# Patient Record
Sex: Female | Born: 1985 | Race: White | Hispanic: No | Marital: Married | Smoking: Never smoker
Health system: Southern US, Community
[De-identification: ages and names within clinical notes are randomized; demographics above are authoritative.]

## PROBLEM LIST (undated history)

## (undated) DIAGNOSIS — Z8489 Family history of other specified conditions: Secondary | ICD-10-CM

## (undated) DIAGNOSIS — N83209 Unspecified ovarian cyst, unspecified side: Secondary | ICD-10-CM

## (undated) DIAGNOSIS — T883XXA Malignant hyperthermia due to anesthesia, initial encounter: Secondary | ICD-10-CM

## (undated) DIAGNOSIS — K219 Gastro-esophageal reflux disease without esophagitis: Secondary | ICD-10-CM

## (undated) HISTORY — DX: Unspecified ovarian cyst, unspecified side: N83.209

## (undated) HISTORY — PX: WISDOM TOOTH EXTRACTION: SHX21

---
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
## 2005-01-07 ENCOUNTER — Emergency Department (HOSPITAL_COMMUNITY): Admission: EM | Admit: 2005-01-07 | Discharge: 2005-01-07 | Payer: Self-pay | Admitting: Emergency Medicine

## 2005-07-12 ENCOUNTER — Observation Stay: Payer: Self-pay | Admitting: Obstetrics and Gynecology

## 2005-09-17 ENCOUNTER — Emergency Department (HOSPITAL_COMMUNITY): Admission: EM | Admit: 2005-09-17 | Discharge: 2005-09-17 | Payer: Self-pay | Admitting: Emergency Medicine

## 2005-10-19 ENCOUNTER — Inpatient Hospital Stay: Payer: Self-pay | Admitting: Unknown Physician Specialty

## 2008-06-12 ENCOUNTER — Observation Stay: Payer: Self-pay | Admitting: Obstetrics & Gynecology

## 2012-12-26 ENCOUNTER — Emergency Department: Payer: Self-pay | Admitting: Emergency Medicine

## 2013-08-10 ENCOUNTER — Encounter: Payer: Self-pay | Admitting: Physician Assistant

## 2013-08-10 ENCOUNTER — Ambulatory Visit (INDEPENDENT_AMBULATORY_CARE_PROVIDER_SITE_OTHER): Payer: BC Managed Care – PPO | Admitting: Physician Assistant

## 2013-08-10 VITALS — BP 116/72 | HR 76 | Temp 98.4°F | Resp 18 | Ht 61.5 in | Wt 130.0 lb

## 2013-08-10 DIAGNOSIS — R197 Diarrhea, unspecified: Secondary | ICD-10-CM

## 2013-08-10 DIAGNOSIS — R110 Nausea: Secondary | ICD-10-CM

## 2013-08-10 DIAGNOSIS — A084 Viral intestinal infection, unspecified: Secondary | ICD-10-CM

## 2013-08-10 DIAGNOSIS — A088 Other specified intestinal infections: Secondary | ICD-10-CM

## 2013-08-10 LAB — CBC W/MCH & 3 PART DIFF
HCT: 46 % (ref 36.0–46.0)
Hemoglobin: 14.4 g/dL (ref 12.0–15.0)
Lymphocytes Relative: 16 % (ref 12–46)
Lymphs Abs: 0.9 10*3/uL (ref 0.7–4.0)
MCH: 29.1 pg (ref 26.0–34.0)
MCHC: 31.3 g/dL (ref 30.0–36.0)
MCV: 93.1 fL (ref 78.0–100.0)
Neutro Abs: 4 10*3/uL (ref 1.7–7.7)
Neutrophils Relative %: 74 % (ref 43–77)
Platelets: 199 10*3/uL (ref 150–400)
RBC: 4.94 MIL/uL (ref 3.87–5.11)
RDW: 17.1 % — ABNORMAL HIGH (ref 11.5–15.5)
WBC mixed population %: 10 % (ref 3–18)
WBC mixed population: 0.6 10*3/uL (ref 0.1–1.8)
WBC: 5.4 10*3/uL (ref 4.0–10.5)

## 2013-08-10 NOTE — Progress Notes (Signed)
Patient ID: Melissa David MRN: [id number], DOB: [birth year], 28 y.o. Date of Encounter: 08/10/2013, 2:47 PM    Chief Complaint:  Chief Complaint  Patient presents with  . sick x 4-5 days    started with diarrhea now with body aches and fatique     HPI: 28 y.o. year old white female reports that she started feeling sick around Wednesday 08/05/13 - Thursday 08/06/13. At that time  just had some mild diarrhea. However since then the diarrhea has worsened. Now the" diarrhea is just running out like water" and it has increased in frequency.  is having multiple episodes per day now. Also has had some headache. Yesterday she laid down to take a nap and felt achy all over from head to toel. Has had no localized/ focal abdominal pain. Has had some nausea but no vomiting. Has had no fevers or chills.     Home Meds: See attached medication section for any medications that were entered at today's visit. The computer does not put those onto this list.The following list is a list of meds entered prior to today's visit.   No current outpatient prescriptions on file prior to visit.   No current facility-administered medications on file prior to visit.    Allergies: No Known Allergies    Review of Systems: See HPI for pertinent ROS. All other ROS negative.    Physical Exam: Blood pressure 116/72, pulse 76, temperature 98.4 F (36.9 C), temperature source Oral, resp. rate 18, height 5' 1.5" (1.562 m), weight 130 lb (58.968 kg)., Body mass index is 24.17 kg/(m^2). General:  WNWD WF. Appears in no acute distress. Neck: Supple. No thyromegaly. No lymphadenopathy. Lungs: Clear bilaterally to auscultation without wheezes, rales, or rhonchi. Breathing is unlabored. Heart: Regular rhythm. No murmurs, rubs, or gallops. Abdomen: Soft, non-tender, non-distended with normoactive bowel sounds. No hepatomegaly. No rebound/guarding. No obvious abdominal masses. Firmly palpated abdomen and she reports no areas  of tenderness. Msk:  Strength and tone normal for age. Extremities/Skin: Warm and dry. Neuro: Alert and oriented X 3. Moves all extremities spontaneously. Gait is normal. CNII-XII grossly in tact. Psych:  Responds to questions appropriately with a normal affect.   Results for orders placed in visit on 08/10/13  CBC [facility] & 3 PART DIFF      Result Value Ref Range   WBC 5.4  4.0 - 10.5 K/uL   RBC 4.94  3.87 - 5.11 MIL/uL   Hemoglobin 14.4  12.0 - 15.0 g/dL   HCT 40.9  81.1 - 91.4 %   MCV 93.1  78.0 - 100.0 fL   MCH 29.1  26.0 - 34.0 pg   MCHC 31.3  30.0 - 36.0 g/dL   RDW 78.2 (*) 95.6 - 21.3 %   Platelets 199  150 - 400 K/uL   Neutrophils Relative % 74  43 - 77 %   Neutro Abs 4.0  1.7 - 7.7 K/uL   Lymphocytes Relative 16  12 - 46 %   Lymphs Abs 0.9  0.7 - 4.0 K/uL   WBC mixed population % 10  3 - 18 %   WBC mixed population 0.6  0.1 - 1.8 K/uL      ASSESSMENT AND PLAN:  28 y.o. year old female with  1. Diarrhea - CBC w/[facility] & 3 Part Diff  2. Nausea - CBC w/[facility] & 3 Part Diff  Viral Gastroenteritis: Discussed with her that this is most consistent with viral gastroenteritis. If this is the  case then this should run its course and resolve on its own over the next few days. In the meantime drink small sips of fluid frequently to prevent dehydration. Use clear liquid diet including ginger ale and chicken noodle soup. Once diarrhea improves can gradually go to a bland diet as tolerated. If diarrhea not improved in 3 days then followup. If develops fever or local/localized abdominal pain then follow up immediately.  Murray HodgkinsSigned, Adonus Uselman Beth AllgoodDixon, [place], [facility] 08/10/2013 2:47 PM

## 2013-08-24 ENCOUNTER — Ambulatory Visit: Payer: BC Managed Care – PPO | Admitting: Family Medicine

## 2014-02-08 ENCOUNTER — Ambulatory Visit: Payer: BC Managed Care – PPO | Admitting: Family Medicine

## 2014-08-11 ENCOUNTER — Encounter: Payer: Self-pay | Admitting: Physician Assistant

## 2014-08-11 ENCOUNTER — Ambulatory Visit (INDEPENDENT_AMBULATORY_CARE_PROVIDER_SITE_OTHER): Payer: BLUE CROSS/BLUE SHIELD | Admitting: Physician Assistant

## 2014-08-11 VITALS — BP 116/78 | HR 68 | Temp 97.9°F | Resp 18 | Wt 137.0 lb

## 2014-08-11 DIAGNOSIS — R509 Fever, unspecified: Secondary | ICD-10-CM

## 2014-08-11 DIAGNOSIS — L03811 Cellulitis of head [any part, except face]: Secondary | ICD-10-CM

## 2014-08-11 DIAGNOSIS — A084 Viral intestinal infection, unspecified: Secondary | ICD-10-CM

## 2014-08-11 LAB — INFLUENZA A AND B
INFLUENZA A AG: NEGATIVE
Influenza B Ag: NEGATIVE

## 2014-08-11 MED ORDER — SULFAMETHOXAZOLE-TRIMETHOPRIM 800-160 MG PO TABS: 1.0000 | ORAL_TABLET | Freq: Two times a day (BID) | ORAL | Status: DC

## 2014-08-11 NOTE — Progress Notes (Signed)
    Patient ID: Melissa David MRN: [id number], DOB: July 24, 1985, 29 y.o. Date of Encounter: 08/11/2014, 2:28 PM    Chief Complaint:  Chief Complaint  Patient presents with  . bump on back of head,painful  . sick    fever, vomiting, chills     HPI: 29 y.o. year old white female presents with above symptoms.  Says that she vomited one time last night. This morning had some nausea but no further vomiting. Had some diarrhea this morning. Last night woke up sweating so assumes that she was having fever but has not actually checked it with a thermometer. Has not eaten today to know whether she may have any further vomiting or diarrhea if she does have anything to eat.  Says that she noticed this bump on the lower aspect of posterior of her scalp Friday 08/06/14. Says that by that Sunday, February 21 it seemed to be more sore. Says that since then it ahas seemed to stay about the same--no worse but really no better either. Has had no drainage from site.      Home Meds:   No outpatient prescriptions prior to visit.   No facility-administered medications prior to visit.    Allergies: No Known Allergies    Review of Systems: See HPI for pertinent ROS. All other ROS negative.    Physical Exam: Blood pressure 116/78, pulse 68, temperature 97.9 F (36.6 C), temperature source Oral, resp. rate 18, weight 137 lb (62.143 kg)., Body mass index is 25.47 kg/(m^2). General: WNWD WF.  Appears in no acute distress. Neck: Supple. No thyromegaly. No lymphadenopathy. Lungs: Clear bilaterally to auscultation without wheezes, rales, or rhonchi. Breathing is unlabored. Heart: Regular rhythm. No murmurs, rubs, or gallops. Abdomen: Soft, non-tender, non-distended with normoactive bowel sounds. No hepatomegaly. No rebound/guarding. No obvious abdominal masses. Abdomen is soft. There is no area of tenderness with palpation. Msk:  Strength and tone normal for age. Extremities/Skin: Warm and  dry. Posterior Head---Approx 1 inch above hairline at posterior neck-- there is approx 1/4 cm diameter area of golden scab. Not raised. No underlying abscess. No erythema.  Neuro: Alert and oriented X 3. Moves all extremities spontaneously. Gait is normal. CNII-XII grossly in tact. Psych:  Responds to questions appropriately with a normal affect.     ASSESSMENT AND PLAN:  29 y.o. year old female with  1. Viral gastroenteritis Clear liquid diet and then gradually slowly convert to bland diet.  2. Cellulitis of scalp Reassured patient of findings of the scalp.  she is concerned--says it has been throbbing pain. There is nothing present for me to incise and drain. Will go ahead and treat with antibiotic. Discussed with her that her nausea vomiting diarrhea must resolve and she must be keeping down food before starting the antibiotics so they do not cause further GI upset. - sulfamethoxazole-trimethoprim (BACTRIM DS,SEPTRA DS) 800-160 MG per tablet; Take 1 tablet by mouth 2 (two) times daily.  Dispense: 14 tablet; Refill: 0  3. Fever and chills - Influenza a and b Results for orders placed or performed in visit on 08/11/14  Influenza a and b  Result Value Ref Range   Source-INFBD NASAL    Inflenza A Ag NEG Negative   Influenza B Ag NEG Negative     Signed, [address]Mary Beth DresdenDixon, [place], [facility] 08/11/2014 2:28 PM

## 2015-03-02 ENCOUNTER — Encounter: Payer: Self-pay | Admitting: Family Medicine

## 2015-03-02 ENCOUNTER — Ambulatory Visit (INDEPENDENT_AMBULATORY_CARE_PROVIDER_SITE_OTHER): Payer: BLUE CROSS/BLUE SHIELD | Admitting: Family Medicine

## 2015-03-02 VITALS — BP 116/70 | HR 82 | Temp 97.9°F | Resp 14 | Ht 61.5 in | Wt 138.0 lb

## 2015-03-02 DIAGNOSIS — L0201 Cutaneous abscess of face: Secondary | ICD-10-CM

## 2015-03-02 MED ORDER — CEPHALEXIN 500 MG PO CAPS: 500.0000 mg | ORAL_CAPSULE | Freq: Two times a day (BID) | ORAL | Status: DC

## 2015-03-02 NOTE — Progress Notes (Signed)
Patient ID: Melissa David, female   DOB: 1985/10/28, 29 y.o.   MRN: [id number]   Subjective:    Patient ID: Melissa David, female    DOB: 13-Dec-1985, 29 y.o.   MRN: [id number]  Patient presents for Hard Knot to L Cheek  Pt here with swelling to her left cheek, 4 days ago a red bump came up, she thought it was a pimple and tried to pop it but no fluid came out, since then she has a hard knot and has had swelling of her cheek, worse in the morning the past 2 days, the area is also a little tender. Denies any fever, N/V, no sore throat or recent illness.   History of severe strep/staph infection on face as a child     Review Of Systems:  GEN- denies fatigue, fever, weight loss,weakness, recent illness HEENT- denies eye drainage, change in vision, nasal discharge, CVS- denies chest pain, palpitations RESP- denies SOB, cough, wheeze ABD- denies N/V, change in stools, abd pain GU- denies dysuria, hematuria, dribbling, incontinence MSK- denies joint pain, muscle aches, injury Neuro- denies headache, dizziness, syncope, seizure activity       Objective:    BP 116/70 mmHg  Pulse 82  Temp(Src) 97.9 F (36.6 C) (Oral)  Resp 14  Ht 5' 1.5" (1.562 m)  Wt 138 lb (62.596 kg)  BMI 25.66 kg/m2  LMP 02/19/2015 GEN- NAD, alert and oriented x3 HEENT- PERRL, EOMI, non injected sclera, pink conjunctiva, MMM, oropharynx clear Neck- Supple, no LAD Skin -- Left cheek- dime size indurated area , no fluctance, mild swelling, no erythema,mild TTP , no other skin lesions        Assessment & Plan:      Problem List Items Addressed This Visit    None    Visit Diagnoses    Facial abscess    -  Primary    I think she is developing a small abscess over her cheek, this is early on and with her history of infection will start Keflex antibotics, warm compresses, discussed red flags to return if swelling worsend. No area to open today        Note: This dictation was prepared with Dragon dictation  along with smaller phrase technology. Any transcriptional errors that result from this process are unintentional.

## 2015-03-02 NOTE — Patient Instructions (Signed)
Antibiotics as prescribed Use compresses 3-4 times a day  F/U as previous

## 2015-05-16 ENCOUNTER — Encounter: Payer: Self-pay | Admitting: Family Medicine

## 2015-05-16 ENCOUNTER — Ambulatory Visit (INDEPENDENT_AMBULATORY_CARE_PROVIDER_SITE_OTHER): Payer: BLUE CROSS/BLUE SHIELD | Admitting: Family Medicine

## 2015-05-16 VITALS — BP 128/78 | HR 90 | Temp 98.1°F | Resp 18 | Ht 61.5 in | Wt 142.0 lb

## 2015-05-16 DIAGNOSIS — J040 Acute laryngitis: Secondary | ICD-10-CM

## 2015-05-16 DIAGNOSIS — J0100 Acute maxillary sinusitis, unspecified: Secondary | ICD-10-CM

## 2015-05-16 MED ORDER — FLUTICASONE PROPIONATE 50 MCG/ACT NA SUSP: 2.0000 | Freq: Every day | NASAL | Status: DC

## 2015-05-16 MED ORDER — AMOXICILLIN 500 MG PO CAPS: 500.0000 mg | ORAL_CAPSULE | Freq: Three times a day (TID) | ORAL | Status: DC

## 2015-05-16 MED ORDER — GUAIFENESIN-CODEINE 100-10 MG/5ML PO SOLN: 5.0000 mL | Freq: Four times a day (QID) | ORAL | Status: DC | PRN

## 2015-05-16 NOTE — Progress Notes (Signed)
Patient ID: Melissa David, female   DOB: 08/24/1985, 29 y.o.   MRN: [id number]   Subjective:    Patient ID: Melissa David, female    DOB: 08/24/1985, 29 y.o.   MRN: [id number]  Patient presents for Illness   Pt here with sinus pressure, drainage, cough with productive, mostly from drainage- has to clear throat all day. Symptoms > 1 week, +sick contact with daughter, no fever  Hoarse for the past 4 days. Using mucinex with minimal improvement    Review Of Systems:  GEN- denies fatigue, fever, weight loss,weakness, recent illness HEENT- denies eye drainage, change in vision, +nasal discharge, CVS- denies chest pain, palpitations RESP- denies SOB, cough, wheeze ABD- denies N/V, change in stools, abd pain Neuro- + headache, dizziness, syncope, seizure activity       Objective:    BP 128/78 mmHg  Pulse 90  Temp(Src) 98.1 F (36.7 C) (Oral)  Resp 18  Ht 5' 1.5" (1.562 m)  Wt 142 lb (64.411 kg)  BMI 26.40 kg/m2  SpO2 99% GEN- NAD, alert and oriented x3, hoarse voice  HEENT- PERRL, EOMI, non injected sclera, pink conjunctiva, MMM, oropharynx mild injection, TM clear bilat no effusion,  + maxillary sinus tenderness, inflammed turbinates,  Nasal drainage  Neck- Supple, no LAD CVS- RRR, no murmur RESP-CTAB EXT- No edema Pulses- Radial 2+          Assessment & Plan:      Problem List Items Addressed This Visit    None    Visit Diagnoses    Acute maxillary sinusitis, recurrence not specified    -  Primary    Relevant Medications    amoxicillin (AMOXIL) 500 MG capsule    guaiFENesin-codeine 100-10 MG/5ML syrup    Acute laryngitis           Note: This dictation was prepared with Dragon dictation along with smaller phrase technology. Any transcriptional errors that result from this process are unintentional.

## 2015-05-16 NOTE — Patient Instructions (Signed)
Take antibiotics as prescribed Use flonase Cough syrup F/U as needed

## 2015-11-07 ENCOUNTER — Ambulatory Visit (INDEPENDENT_AMBULATORY_CARE_PROVIDER_SITE_OTHER): Payer: BLUE CROSS/BLUE SHIELD | Admitting: Family Medicine

## 2015-11-07 ENCOUNTER — Encounter: Payer: Self-pay | Admitting: Family Medicine

## 2015-11-07 VITALS — BP 110/74 | HR 80 | Temp 98.1°F | Resp 16 | Wt 140.0 lb

## 2015-11-07 DIAGNOSIS — J069 Acute upper respiratory infection, unspecified: Secondary | ICD-10-CM

## 2015-11-07 MED ORDER — AZITHROMYCIN 250 MG PO TABS: ORAL_TABLET | ORAL | Status: DC

## 2015-11-07 NOTE — Progress Notes (Signed)
   Subjective:    Patient ID: Melissa David, female    DOB: 02/10/1986, 30 y.o.   MRN: [id number]  HPI Son has strep throat. Daughter has left otitis media. At approximately the same time, the patient developed a severe cough that will not stop. Cough is productive of yellow sputum and she reports chest congestion subjective fevers and a sore throat. She denies any rhinorrhea or head congestion or sinus pain No past medical history on file. No past surgical history on file. Current Outpatient Prescriptions on File Prior to Visit  Medication Sig Dispense Refill  . fluticasone (FLONASE) 50 MCG/ACT nasal spray Place 2 sprays into both nostrils daily. 16 g 6   No current facility-administered medications on file prior to visit.   No Known Allergies Social History   Social History  . Marital Status: Single    Spouse Name: N/A  . Number of Children: N/A  . Years of Education: N/A   Occupational History  . Not on file.   Social History Main Topics  . Smoking status: Never Smoker   . Smokeless tobacco: Never Used  . Alcohol Use: No  . Drug Use: No  . Sexual Activity: Not on file   Other Topics Concern  . Not on file   Social History Narrative      Review of Systems  All other systems reviewed and are negative.      Objective:   Physical Exam  Constitutional: She appears well-developed and well-nourished.  HENT:  Nose: Nose normal.  Mouth/Throat: Oropharynx is clear and moist. No oropharyngeal exudate.  Eyes: Conjunctivae are normal.  Neck: Neck supple.  Cardiovascular: Normal rate, regular rhythm and normal heart sounds.   No murmur heard. Pulmonary/Chest: Effort normal and breath sounds normal. No respiratory distress. She has no wheezes. She has no rales.  Abdominal: Soft. Bowel sounds are normal.  Lymphadenopathy:    She has no cervical adenopathy.  Vitals reviewed.         Assessment & Plan:  Acute URI - Plan: azithromycin (ZITHROMAX) 250 MG  tablet  Usually I would say this is a virus but given her recent sick contacts I'm not so sure. I will treat the patient with a Z-Pak for bacterial upper respiratory infection. She can use Mucinex for cough or Delsym for cough

## 2016-10-11 ENCOUNTER — Encounter: Payer: Self-pay | Admitting: Family Medicine

## 2016-12-24 ENCOUNTER — Encounter: Payer: Self-pay | Admitting: Family Medicine

## 2017-03-28 ENCOUNTER — Ambulatory Visit: Payer: BLUE CROSS/BLUE SHIELD | Admitting: Physician Assistant

## 2017-05-23 ENCOUNTER — Ambulatory Visit: Payer: BLUE CROSS/BLUE SHIELD | Admitting: Maternal Newborn

## 2018-04-08 ENCOUNTER — Ambulatory Visit (INDEPENDENT_AMBULATORY_CARE_PROVIDER_SITE_OTHER): Payer: BLUE CROSS/BLUE SHIELD | Admitting: Family Medicine

## 2018-04-08 ENCOUNTER — Encounter: Payer: Self-pay | Admitting: Family Medicine

## 2018-04-08 VITALS — BP 110/70 | HR 72 | Temp 98.0°F | Resp 16 | Ht 63.0 in | Wt 143.0 lb

## 2018-04-08 DIAGNOSIS — M19079 Primary osteoarthritis, unspecified ankle and foot: Secondary | ICD-10-CM

## 2018-04-08 MED ORDER — PREDNISONE 20 MG PO TABS: ORAL_TABLET | ORAL | 0 refills | Status: DC

## 2018-04-08 NOTE — Progress Notes (Signed)
Subjective:    Patient ID: Melissa David, female    DOB: [birth year], 32 y.o.   MRN: [id number]  HPI  2 weeks ago, patient developed the gradual onset of pain and swelling in her left first MTP joint.  On visual inspection, the patient has hallux valgum.  The joint itself is slightly erythematous and warm to the touch.  This is asymmetric compared to the right side.  There is no significant effusion or swelling.  She has full dorsiflexion without pain.  Plantarflexion elicits pain along the superior aspect of the first MTP joint.  She denies any falls or injuries or trauma.  She has no history of gout.  She does not drink alcohol or eat excessive meat.  There is no family history of gout or autoimmune arthritides.  No past medical history on file. No past surgical history on file. No current outpatient medications on file prior to visit.   No current facility-administered medications on file prior to visit.    No Known Allergies Social History   Socioeconomic History  . Marital status: Single    Spouse name: Not on file  . Number of children: Not on file  . Years of education: Not on file  . Highest education level: Not on file  Occupational History  . Not on file  Social Needs  . Financial resource strain: Not on file  . Food insecurity:    Worry: Not on file    Inability: Not on file  . Transportation needs:    Medical: Not on file    Non-medical: Not on file  Tobacco Use  . Smoking status: Never Smoker  . Smokeless tobacco: Never Used  Substance and Sexual Activity  . Alcohol use: No  . Drug use: No  . Sexual activity: Not on file  Lifestyle  . Physical activity:    Days per week: Not on file    Minutes per session: Not on file  . Stress: Not on file  Relationships  . Social connections:    Talks on phone: Not on file    Gets together: Not on file    Attends religious service: Not on file    Active member of club or organization: Not on file    Attends meetings  of clubs or organizations: Not on file    Relationship status: Not on file  . Intimate partner violence:    Fear of current or ex partner: Not on file    Emotionally abused: Not on file    Physically abused: Not on file    Forced sexual activity: Not on file  Other Topics Concern  . Not on file  Social History Narrative  . Not on file      Review of Systems  All other systems reviewed and are negative.      Objective:   Physical Exam  Constitutional: She appears well-developed and well-nourished.  Eyes: Conjunctivae are normal.  Neck: Neck supple.  Cardiovascular: Normal rate, regular rhythm and normal heart sounds.  No murmur heard. Pulmonary/Chest: Effort normal and breath sounds normal. No respiratory distress. She has no wheezes. She has no rales.  Abdominal: Soft. Bowel sounds are normal.  Musculoskeletal:       Left ankle: She exhibits deformity. She exhibits no swelling. Tenderness. Achilles tendon normal.       Feet:  Vitals reviewed.         Assessment & Plan:  1st MTP arthritis - Plan: Uric acid, Sedimentation rate,  predniSONE (DELTASONE) 20 MG tablet  I believe the patient is experiencing inflammation around the first MTP joint.  Differential diagnosis includes traumatic arthritis secondary to recent exercise she has been performing, gout, autoimmune arthritides.  I believe septic arthritis is highly unlikely.  There is no penetrating trauma and the erythema is mild.  I recommended prednisone taper pack as she is tried and failed NSAIDs over-the-counter.  I will check a uric acid level and a sedimentation rate.  Recheck if no better after prednisone or immediately if worsening.  Obtain x-ray if no better liver I have a low clinical suspicion for fracture

## 2018-04-09 LAB — SEDIMENTATION RATE: Sed Rate: 6 mm/h (ref 0–20)

## 2018-04-09 LAB — URIC ACID: Uric Acid, Serum: 4.3 mg/dL (ref 2.5–7.0)

## 2018-04-24 ENCOUNTER — Ambulatory Visit (INDEPENDENT_AMBULATORY_CARE_PROVIDER_SITE_OTHER): Payer: BLUE CROSS/BLUE SHIELD

## 2018-04-24 ENCOUNTER — Encounter: Payer: Self-pay | Admitting: Podiatry

## 2018-04-24 ENCOUNTER — Ambulatory Visit: Payer: BLUE CROSS/BLUE SHIELD | Admitting: Podiatry

## 2018-04-24 DIAGNOSIS — M778 Other enthesopathies, not elsewhere classified: Secondary | ICD-10-CM

## 2018-04-24 DIAGNOSIS — M2012 Hallux valgus (acquired), left foot: Secondary | ICD-10-CM | POA: Diagnosis not present

## 2018-04-24 DIAGNOSIS — M779 Enthesopathy, unspecified: Secondary | ICD-10-CM

## 2018-04-24 MED ORDER — MELOXICAM 15 MG PO TABS: 15.0000 mg | ORAL_TABLET | Freq: Every day | ORAL | 3 refills | Status: DC

## 2018-04-24 NOTE — Progress Notes (Signed)
  Subjective:  Patient ID: Melissa David, female    DOB: 02-Mar-1986,  MRN: [id number] HPI Chief Complaint  Patient presents with  . Foot Pain    Patient presents today for left foot pain at 1st mpj joint x 1 month. She states "my toe feels stiff and hard to bend sometimes and burns"  She saw her PCP and was given prednisone x 7 days which didn't help much.  She has also been using ice and elevating    32 y.o. female presents with the above complaint.   ROS: Denies fever chills nausea vomiting muscle aches pains calf pain back pain chest pain shortness of breath.  No past medical history on file. No past surgical history on file.  Current Outpatient Medications:  .  meloxicam (MOBIC) 15 MG tablet, Take 1 tablet (15 mg total) by mouth daily., Disp: 30 tablet, Rfl: 3  No Known Allergies Review of Systems Objective:  There were no vitals filed for this visit.  General: Well developed, nourished, in no acute distress, alert and oriented x3   Dermatological: Skin is warm, dry and supple bilateral. Nails x 10 are well maintained; remaining integument appears unremarkable at this time. There are no open sores, no preulcerative lesions, no rash or signs of infection present.  Vascular: Dorsalis Pedis artery and Posterior Tibial artery pedal pulses are 2/4 bilateral with immedate capillary fill time. Pedal hair growth present. No varicosities and no lower extremity edema present bilateral.   Neruologic: Grossly intact via light touch bilateral. Vibratory intact via tuning fork bilateral. Protective threshold with Semmes Wienstein monofilament intact to all pedal sites bilateral. Patellar and Achilles deep tendon reflexes 2+ bilateral. No Babinski or clonus noted bilateral.   Musculoskeletal: No gross boney pedal deformities bilateral. No pain, crepitus, or limitation noted with foot and ankle range of motion bilateral. Muscular strength 5/5 in all groups tested bilateral.  Hallux abductovalgus  deformity appears to be track bound she has pain on range of motion of the joint there is no crepitation.  Mild edema no erythema cellulitis drainage or odor.  No open lesions or wounds.  Gait: Unassisted, Nonantalgic.    Radiographs:  Radiographs demonstrate an increase in the first intermetatarsal angle greater than normal value consistent with hallux abductovalgus deformity joint space narrowing no spurring.    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  Plan: Discussed etiology pathology conservative versus surgical therapies.  At this point after sterile Betadine skin prep I injected 10 mg of Kenalog and 5 mg of local anesthetic intra-articularly to the first metatarsal phalangeal joint.  She tolerated procedure well without complications.  Started her on meloxicam.  I will follow-up with her in 1 month to 6 weeks.  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  Remember to ask how her trip to [place] [employer] [place] went.     Brad Lieurance T. [place], [place]

## 2018-05-29 ENCOUNTER — Ambulatory Visit: Payer: BLUE CROSS/BLUE SHIELD | Admitting: Podiatry

## 2018-06-03 ENCOUNTER — Encounter: Payer: Self-pay | Admitting: Podiatry

## 2018-06-03 ENCOUNTER — Ambulatory Visit: Payer: BLUE CROSS/BLUE SHIELD | Admitting: Podiatry

## 2018-06-03 DIAGNOSIS — M2012 Hallux valgus (acquired), left foot: Secondary | ICD-10-CM

## 2018-06-03 NOTE — Progress Notes (Signed)
She presents today states that she would like to consider surgical intervention to the first metatarsophalangeal joint of the left foot.  She states is been bothering her for quite some time she is ready to get this corrected.  She states that is starting to affect her ability to perform her day every day activities.  Objective: I have reviewed her past medical history medications allergies surgery social history review of systems.  Pulses are strongly palpable.  She has pain on palpation of the second metatarsal phalangeal joint and pain on palpation of the hypertrophic medial condyle and range of motion of the first metatarsophalangeal joint.  No open lesions or wounds are noted.  Assessment: Capsulitis second metatarsal phalangeal joint elongated plantarflexed second met.  Hallux valgus deformity with capsulitis.  Plan: We discussed etiology pathology conservative versus surgical therapies.  At this point we consented her for an [facility] bunion repair left foot a second metatarsal osteotomy with double screw fixation left foot.  She understands this and signed all 3 pages a consent form.  We discussed the possible postop complications which may include but not limited to postop pain bleeding swelling infection recurrence need further surgery overcorrection under correction loss of digit loss of limb loss of life.  Dispensed a Cam walker today provided her with both oral and written home-going instructions for the care of her foot as well as her in the morning of preop as well as information regarding the surgery center and the anesthesia group.

## 2018-06-03 NOTE — Patient Instructions (Signed)
Pre-Operative Instructions  Congratulations, you have decided to take an important step towards improving your quality of life.  You can be assured that the doctors and staff at [facility] will be with you every step of the way.  Here are some important things you should know:  1. Plan to be at the surgery center/hospital at least 1 (one) hour prior to your scheduled time, unless otherwise directed by the surgical center/hospital staff.  You must have a responsible adult accompany you, remain during the surgery and drive you home.  Make sure you have directions to the surgical center/hospital to ensure you arrive on time. 2. If you are having surgery at [facility] or [facility], you will need a copy of your medical history and physical form from your family physician within one month prior to the date of surgery. We will give you a form for your primary physician to complete.  3. We make every effort to accommodate the date you request for surgery.  However, there are times where surgery dates or times have to be moved.  We will contact you as soon as possible if a change in schedule is required.   4. No aspirin/ibuprofen for one week before surgery.  If you are on aspirin, any non-steroidal anti-inflammatory medications (Mobic, Aleve, Ibuprofen) should not be taken seven (7) days prior to your surgery.  You make take Tylenol for pain prior to surgery.  5. Medications - If you are taking daily heart and blood pressure medications, seizure, reflux, allergy, asthma, anxiety, pain or diabetes medications, make sure you notify the surgery center/hospital before the day of surgery so they can tell you which medications you should take or avoid the day of surgery. 6. No food or drink after midnight the night before surgery unless directed otherwise by surgical center/hospital staff. 7. No alcoholic beverages 24-hours prior to surgery.  No smoking 24-hours prior or 24-hours after  surgery. 8. Wear loose pants or shorts. They should be loose enough to fit over bandages, boots, and casts. 9. Don't wear slip-on shoes. Sneakers are preferred. 10. Bring your boot with you to the surgery center/hospital.  Also bring crutches or a walker if your physician has prescribed it for you.  If you do not have this equipment, it will be provided for you after surgery. 11. If you have not been contacted by the surgery center/hospital by the day before your surgery, call to confirm the date and time of your surgery. 12. Leave-time from work may vary depending on the type of surgery you have.  Appropriate arrangements should be made prior to surgery with your employer. 13. Prescriptions will be provided immediately following surgery by your doctor.  Fill these as soon as possible after surgery and take the medication as directed. Pain medications will not be refilled on weekends and must be approved by the doctor. 14. Remove nail polish on the operative foot and avoid getting pedicures prior to surgery. 15. Wash the night before surgery.  The night before surgery wash the foot and leg well with water and the antibacterial soap provided. Be sure to pay special attention to beneath the toenails and in between the toes.  Wash for at least three (3) minutes. Rinse thoroughly with water and dry well with a towel.  Perform this wash unless told not to do so by your physician.  Enclosed: 1 Ice pack (please put in freezer the night before surgery)   1 Hibiclens skin cleaner     Pre-op instructions  If you have any questions regarding the instructions, please do not hesitate to call our office.  [place]: [address], [place], [place] [address] -- [phone]  [place]: [address] [place], [place] [address] -- [phone]  [place]: [address]  [place], [place] [address] -- [phone]  [place]: [address], [place], [place] [address] -- [phone]  Website:  [email or web address] 

## 2018-06-05 ENCOUNTER — Ambulatory Visit: Payer: BLUE CROSS/BLUE SHIELD | Admitting: Podiatry

## 2018-06-09 ENCOUNTER — Other Ambulatory Visit: Payer: Self-pay | Admitting: Podiatry

## 2018-06-09 MED ORDER — OXYCODONE-ACETAMINOPHEN 10-325 MG PO TABS: 1.0000 | ORAL_TABLET | Freq: Four times a day (QID) | ORAL | 0 refills | Status: DC | PRN

## 2018-06-09 MED ORDER — ONDANSETRON HCL 4 MG PO TABS: 4.0000 mg | ORAL_TABLET | Freq: Three times a day (TID) | ORAL | 0 refills | Status: DC | PRN

## 2018-06-09 MED ORDER — CEPHALEXIN 500 MG PO CAPS: 500.0000 mg | ORAL_CAPSULE | Freq: Three times a day (TID) | ORAL | 0 refills | Status: DC

## 2018-06-13 ENCOUNTER — Telehealth: Payer: Self-pay | Admitting: *Deleted

## 2018-06-13 ENCOUNTER — Telehealth: Payer: Self-pay | Admitting: Podiatry

## 2018-06-13 NOTE — Telephone Encounter (Signed)
Pt is having a issue with being put to sleep because she is having a block done on the leg, please call patient about the sedation

## 2018-06-13 NOTE — Telephone Encounter (Addendum)
I was informed by someone from the surgical center that I am not going to be able to have my surgery there.  The anesthesiologist said due to my mom having a history of Malignohyperthermia, I cannot have the surgery there.  They said I would have to have it done at the hospital.  I have been preparing for this surgery a long time.  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  I need to have my surgery during this time."  Unfortunately Dr. Al CorpusHyatt does not do surgeries at [facility].  You need to schedule an appointment with another one of our doctors.  Dr. Samuella CotaPrice can see you on Thursday, January 2 at 4:30 pm.  Would you like to see him?  "Go ahead and schedule me an appointment with him.  I will call you back after I speak with my mom."  I put in a request for a surgery block at 7 am at [facility] OR on 06/20/2018.

## 2018-06-16 NOTE — Telephone Encounter (Signed)
"  I left a message with the nurse for Dr. Al CorpusHyatt on Friday.  I still haven't received a response."  You're scheduled for surgery today, correct?  "It was supposed to be today.  I had called and asked if Dr. Al CorpusHyatt can do it with me just getting the block. I haven't heard anything from him nor anyone else."  Dr. Al CorpusHyatt has been out of the office.  He was doing surgery all day on Friday.  I have never known him to do the procedures you are having without the person being put to sleep.  "Okay, so what's the name of the doctor that I'm going to be seeing and what time is my appointment?"  You will be seeing Dr. Samuella CotaPrice and your appointment is scheduled for 4:30 pm on Thursday.

## 2018-06-19 ENCOUNTER — Encounter: Payer: Self-pay | Admitting: Family Medicine

## 2018-06-19 ENCOUNTER — Encounter (HOSPITAL_COMMUNITY): Payer: Self-pay | Admitting: *Deleted

## 2018-06-19 ENCOUNTER — Ambulatory Visit (INDEPENDENT_AMBULATORY_CARE_PROVIDER_SITE_OTHER): Payer: BLUE CROSS/BLUE SHIELD | Admitting: Family Medicine

## 2018-06-19 ENCOUNTER — Other Ambulatory Visit: Payer: Self-pay

## 2018-06-19 ENCOUNTER — Ambulatory Visit (INDEPENDENT_AMBULATORY_CARE_PROVIDER_SITE_OTHER): Payer: BLUE CROSS/BLUE SHIELD | Admitting: Podiatry

## 2018-06-19 VITALS — BP 108/70 | HR 67 | Temp 98.3°F | Resp 16 | Ht 63.0 in | Wt 144.2 lb

## 2018-06-19 DIAGNOSIS — M778 Other enthesopathies, not elsewhere classified: Secondary | ICD-10-CM

## 2018-06-19 DIAGNOSIS — Z01818 Encounter for other preprocedural examination: Secondary | ICD-10-CM | POA: Diagnosis not present

## 2018-06-19 DIAGNOSIS — Z8489 Family history of other specified conditions: Secondary | ICD-10-CM | POA: Diagnosis not present

## 2018-06-19 DIAGNOSIS — M779 Enthesopathy, unspecified: Secondary | ICD-10-CM | POA: Diagnosis not present

## 2018-06-19 DIAGNOSIS — M2012 Hallux valgus (acquired), left foot: Secondary | ICD-10-CM

## 2018-06-19 DIAGNOSIS — M21962 Unspecified acquired deformity of left lower leg: Secondary | ICD-10-CM | POA: Diagnosis not present

## 2018-06-19 DIAGNOSIS — Z0000 Encounter for general adult medical examination without abnormal findings: Secondary | ICD-10-CM | POA: Diagnosis not present

## 2018-06-19 DIAGNOSIS — M21612 Bunion of left foot: Secondary | ICD-10-CM

## 2018-06-19 NOTE — Telephone Encounter (Signed)
Dr. Al Corpus said the procedure could not be done without the general anesthetic.

## 2018-06-19 NOTE — Progress Notes (Signed)
  Subjective:  Patient ID: Melissa David, female    DOB: 12/15/1985,  MRN: [id number]  Chief Complaint  Patient presents with  . Consult    Hyatt patient    32 y.o. female presents with the above complaint.  Here for surgical evaluation for left foot pain with bunion deformity.  Will still have surgery Dr. Hyatt however her mother has a history of malignant hyperthermia and for this reason she was referred to me for surgery at the hospital.  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  States that she is very active and has a lot of pain when she tries to work out.  Review of Systems: Negative except as noted in the HPI. Denies N/V/F/Ch.  Past Medical History:  Diagnosis Date  . Family history of adverse reaction to anesthesia    malignant hyperthermia  . GERD (gastroesophageal reflux disease)   . Malignant hyperthermia    06/19/18: Patient's MOTHER has biopsy proven MH. (Patient to inquire about testing for herself and children).   No current outpatient medications on file.  Social History   Tobacco Use  Smoking Status Never Smoker  Smokeless Tobacco Never Used    Allergies  Allergen Reactions  . Other Other (See Comments)    --family hx of malignant hyperthermia - no personal hx, no problem with c-section or dental procedures in the past --[Certain anesthetics that can cause Malignant Hyperthermia  ] NKDA recorded   Objective:  There were no vitals filed for this visit. There is no height or weight on file to calculate BMI. Constitutional Well developed. Well nourished.  Vascular Dorsalis pedis pulses palpable bilaterally. Posterior tibial pulses palpable bilaterally. Capillary refill normal to all digits.  No cyanosis or clubbing noted. Pedal hair growth normal.  Neurologic Normal speech. Oriented to person, place, and time. Epicritic sensation to light touch grossly present bilaterally.  Dermatologic Nails  well groomed and normal in appearance. No open wounds. No skin lesions.  Orthopedic: Normal joint ROM without pain or crepitus bilaterally. Hallux abductovalgus deformity present - left Left 1st MPJ full range of motion. Left 1st TMT without gross hypermobility. Right 1st MPJ full range of motion  Right 1st TMT without gross hypermobility. Lesser digital contractures absent bilaterally. Pain palpation with the left second MPJ   Radiographs: Reviewed. Hallux abductovalgus deformity present. Metatarsal parabola abnormal - Long second metatarsal  Assessment:   1. Hav (hallux abducto valgus), left   2. Capsulitis of foot, left   3. Metatarsal deformity, left   4. 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 Family history of malignant hyperthermia    Plan:  Patient was evaluated and treated and all questions answered.  Hallux abductovalgus deformity,  -XR reviewed as above. -Patient has failed all conservative therapy and wishes to proceed with surgical intervention. All risks, benefits, and alternatives discussed with patient. No guarantees given. Consent reviewed and signed by patient. Post-op course explained at length. -Planned procedures: Left foot Austin bunionectomy, second metatarsal shortening osteotomy -Risk factors: Family history malignant hyperthermia  Return for post op.  

## 2018-06-19 NOTE — Progress Notes (Signed)
Spoke with pt for pre-op call. Pt denies cardiac history, HTN or Diabetes. 

## 2018-06-19 NOTE — Patient Instructions (Signed)

## 2018-06-19 NOTE — Anesthesia Preprocedure Evaluation (Addendum)
Anesthesia Evaluation  Patient identified by MRN, date of birth, ID band Patient awake    Reviewed: Allergy & Precautions, NPO status , Patient's Chart, lab work & pertinent test results  History of Anesthesia Complications (+) Family history of anesthesia reaction and history of anesthetic complications (Biopsy confirmed MH in mother)  Airway Mallampati: I  TM Distance: >3 FB Neck ROM: Full    Dental  (+) Teeth Intact, Dental Advisory Given   Pulmonary neg pulmonary ROS,    Pulmonary exam normal breath sounds clear to auscultation       Cardiovascular negative cardio ROS Normal cardiovascular exam Rhythm:Regular Rate:Normal     Neuro/Psych negative neurological ROS     GI/Hepatic Neg liver ROS, GERD  ,  Endo/Other  negative endocrine ROS  Renal/GU negative Renal ROS     Musculoskeletal negative musculoskeletal ROS (+)   Abdominal   Peds  Hematology negative hematology ROS (+)   Anesthesia Other Findings Day of surgery medications reviewed with the patient.  Reproductive/Obstetrics                            Anesthesia Physical Anesthesia Plan  ASA: I  Anesthesia Plan: MAC   Post-op Pain Management:    Induction:   PONV Risk Score and Plan: 2 and Treatment may vary due to age or medical condition and Propofol infusion  Airway Management Planned: Natural Airway and Nasal Cannula  Additional Equipment:   Intra-op Plan:   Post-operative Plan:   Informed Consent: I have reviewed the patients History and Physical, chart, labs and discussed the procedure including the risks, benefits and alternatives for the proposed anesthesia with the patient or authorized representative who has indicated his/her understanding and acceptance.     Plan Discussed with: CRNA  Anesthesia Plan Comments:         Anesthesia Quick Evaluation

## 2018-06-19 NOTE — Patient Instructions (Signed)
Pre-Operative Instructions  Congratulations, you have decided to take an important step towards improving your quality of life.  You can be assured that the doctors and staff at [facility] will be with you every step of the way.  Here are some important things you should know:  1. Plan to be at the surgery center/hospital at least 1 (one) hour prior to your scheduled time, unless otherwise directed by the surgical center/hospital staff.  You must have a responsible adult accompany you, remain during the surgery and drive you home.  Make sure you have directions to the surgical center/hospital to ensure you arrive on time. 2. If you are having surgery at [facility] or [facility] hospitals, you will need a copy of your medical history and physical form from your family physician within one month prior to the date of surgery. We will give you a form for your primary physician to complete.  3. We make every effort to accommodate the date you request for surgery.  However, there are times where surgery dates or times have to be moved.  We will contact you as soon as possible if a change in schedule is required.   4. No aspirin/ibuprofen for one week before surgery.  If you are on aspirin, any non-steroidal anti-inflammatory medications (Mobic, Aleve, Ibuprofen) should not be taken seven (7) days prior to your surgery.  You make take Tylenol for pain prior to surgery.  5. Medications - If you are taking daily heart and blood pressure medications, seizure, reflux, allergy, asthma, anxiety, pain or diabetes medications, make sure you notify the surgery center/hospital before the day of surgery so they can tell you which medications you should take or avoid the day of surgery. 6. No food or drink after midnight the night before surgery unless directed otherwise by surgical center/hospital staff. 7. No alcoholic beverages 24-hours prior to surgery.  No smoking 24-hours prior or 24-hours after  surgery. 8. Wear loose pants or shorts. They should be loose enough to fit over bandages, boots, and casts. 9. Don't wear slip-on shoes. Sneakers are preferred. 10. Bring your boot with you to the surgery center/hospital.  Also bring crutches or a walker if your physician has prescribed it for you.  If you do not have this equipment, it will be provided for you after surgery. 11. If you have not been contacted by the surgery center/hospital by the day before your surgery, call to confirm the date and time of your surgery. 12. Leave-time from work may vary depending on the type of surgery you have.  Appropriate arrangements should be made prior to surgery with your employer. 13. Prescriptions will be provided immediately following surgery by your doctor.  Fill these as soon as possible after surgery and take the medication as directed. Pain medications will not be refilled on weekends and must be approved by the doctor. 14. Remove nail polish on the operative foot and avoid getting pedicures prior to surgery. 15. Wash the night before surgery.  The night before surgery wash the foot and leg well with water and the antibacterial soap provided. Be sure to pay special attention to beneath the toenails and in between the toes.  Wash for at least three (3) minutes. Rinse thoroughly with water and dry well with a towel.  Perform this wash unless told not to do so by your physician.  Enclosed: 1 Ice pack (please put in freezer the night before surgery)   1 Hibiclens skin cleaner     Pre-op instructions  If you have any questions regarding the instructions, please do not hesitate to call our office.  [place]: [address], [place], [place] [address] -- [phone]  [place]: [address] [place], [place] [address] -- [phone]  [place]: [address]  [place], [place] [address] -- [phone]  [place]: [address], [place], [place] [address] -- [phone]  Website:  [email or web address] 

## 2018-06-19 NOTE — H&P (View-Only) (Signed)
  Subjective:  Patient ID: Melissa David, female    DOB: 04/11/1986,  MRN: [id number]  Chief Complaint  Patient presents with  . Consult    Hyatt patient    33 y.o. female presents with the above complaint.  Here for surgical evaluation for left foot pain with bunion deformity.  Will still have surgery Dr. Al Corpus however her mother has a history of malignant hyperthermia and for this reason she was referred to me for surgery at the hospital.  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  States that she is very active and has a lot of pain when she tries to work out.  Review of Systems: Negative except as noted in the HPI. Denies N/V/F/Ch.  Past Medical History:  Diagnosis Date  . Family history of adverse reaction to anesthesia    malignant hyperthermia  . GERD (gastroesophageal reflux disease)   . Malignant hyperthermia    06/19/18: Patient's MOTHER has biopsy proven MH. (Patient to inquire about testing for herself and children).   No current outpatient medications on file.  Social History   Tobacco Use  Smoking Status Never Smoker  Smokeless Tobacco Never Used    Allergies  Allergen Reactions  . Other Other (See Comments)    --family hx of malignant hyperthermia - no personal hx, no problem with c-section or dental procedures in the past --[Certain anesthetics that can cause Malignant Hyperthermia  ] NKDA recorded   Objective:  There were no vitals filed for this visit. There is no height or weight on file to calculate BMI. Constitutional Well developed. Well nourished.  Vascular Dorsalis pedis pulses palpable bilaterally. Posterior tibial pulses palpable bilaterally. Capillary refill normal to all digits.  No cyanosis or clubbing noted. Pedal hair growth normal.  Neurologic Normal speech. Oriented to person, place, and time. Epicritic sensation to light touch grossly present bilaterally.  Dermatologic Nails  well groomed and normal in appearance. No open wounds. No skin lesions.  Orthopedic: Normal joint ROM without pain or crepitus bilaterally. Hallux abductovalgus deformity present - left Left 1st MPJ full range of motion. Left 1st TMT without gross hypermobility. Right 1st MPJ full range of motion  Right 1st TMT without gross hypermobility. Lesser digital contractures absent bilaterally. Pain palpation with the left second MPJ   Radiographs: Reviewed. Hallux abductovalgus deformity present. Metatarsal parabola abnormal - Long second metatarsal  Assessment:   1. Hav (hallux abducto valgus), left   2. Capsulitis of foot, left   3. Metatarsal deformity, left   4. 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 Family history of malignant hyperthermia    Plan:  Patient was evaluated and treated and all questions answered.  Hallux abductovalgus deformity,  -XR reviewed as above. -Patient has failed all conservative therapy and wishes to proceed with surgical intervention. All risks, benefits, and alternatives discussed with patient. No guarantees given. Consent reviewed and signed by patient. Post-op course explained at length. -Planned procedures: Left foot Austin bunionectomy, second metatarsal shortening osteotomy -Risk factors: Family history malignant hyperthermia  Return for post op.

## 2018-06-19 NOTE — Progress Notes (Signed)
Anesthesia Chart Review: Melissa David   Case:  [id number] Date/Time:  06/20/18 0715   Procedures:      [name] BUNIONECTOMY (Left )     METATARSAL OSTEOTOMY 2ND LEFT (Left )   Anesthesia type:  Monitor Anesthesia Care   Pre-op diagnosis:  HALLUX ABDUCTO VALGUS, CAPSULITIS   Location:  [facility] OR ROOM 04 / [place] [facility]   Surgeon:  [name], DPM      DISCUSSION: Patient is a 33 year old female scheduled for the above procedure. Surgery was initially scheduled at a surgical center with [name], Max, DPM, but was moved to [facility] OR with Dr. [name] due to patient's mother having a history of biopsy proven MALIGNANT HYPERTHERMIA following tonsillectomy around age 36-19, otherwise not many details known per patient. (Patient is planning to contact her insurance company to inquire about testing coverage for her and her daughter. I also advised her to contact [facility] for more information about their [place].)   History includes never smoker, GERD, wisdom teeth, c-section. Family history of MALIGNANT HYPERTHERMIA. Patient works as a Emergency planning/management officer.   She was seen at [facility] on 06/19/18 by [name], PA-C for annual exam and H&P.  She had labs drawn (including CBC, CMET) at there 06/19/18 PCP visit but not resulted yet in [place]. Would anticipate that results should be available tomorrow morning, but if not staff can clarify with her anesthesiologist if additional labs needed. She would need a urine pregnancy test.  Family history of MH communicated to the CRNA staff and written on anesthesiologist communication board. Case is posted for [facility]. Definitive anesthesia plan following their evaluation prior to surgery.    VS: LMP 06/05/2018 (Approximate)   PROVIDERS: [name], MD is PCP   LABS: Patient had CBC, CMET, Lipid panel, and TSH had her annual physical on 06/19/18--results currently in process. She will need a urine pregnancy test on the day of  surgery.    IMAGES: N/A   EKG: N/A   CV: N/A  Past Medical History:  Diagnosis Date  . Family history of adverse reaction to anesthesia    malignant hyperthermia  . GERD (gastroesophageal reflux disease)   . Malignant hyperthermia    06/19/18: Patient's MOTHER has biopsy proven MH. (Patient to inquire about testing for herself and children).    Past Surgical History:  Procedure Laterality Date  . CESAREAN SECTION     2007 and 2010  . WISDOM TOOTH EXTRACTION      MEDICATIONS: No current facility-administered medications for this encounter.    No current outpatient medications on file.    [name], PA-C Surgical Short Stay/Anesthesiology [facility] Phone [phone] [facility] Phone [phone] 06/19/2018 5:57 PM

## 2018-06-19 NOTE — Progress Notes (Signed)
Patient: Melissa David, Female    DOB: 12-06-1985, 33 y.o.   MRN: [id number] Visit Date: 06/19/2018  Today's Provider: Danelle Berry, PA-C   Chief Complaint  Patient presents with  . Annual Exam    Patient is fasting today denies any concerns this visit. Patient has scheduled surgery for tomorrow has form to be filled out    Subjective:    Annual physical exam ILSE MCKERN is a 33 y.o. female who presents today for health maintenance and complete physical. She feels well. She reports exercising not much right now due to left foot pain and issues, previously was exercising 5d/wk. She reports she is sleeping well.  -----------------------------------------------------------------   Pt brings in form for pre-op clearance for procedure she is having tomorrow morning.    Review of Systems  Constitutional: Negative.   HENT: Negative.   Eyes: Negative.   Respiratory: Negative.   Cardiovascular: Negative.   Gastrointestinal: Negative.   Endocrine: Negative.   Genitourinary: Negative.   Musculoskeletal: Negative.   Skin: Negative.   Allergic/Immunologic: Negative.   Neurological: Negative.   Hematological: Negative.   Psychiatric/Behavioral: Negative.   All other systems reviewed and are negative.      Social History      She  reports that she has never smoked. She has never used smokeless tobacco. She reports that she does not drink alcohol or use drugs.       Social History   Socioeconomic History  . Marital status: Married    Spouse name: Not on file  . Number of children: Not on file  . Years of education: Not on file  . Highest education level: Not on file  Occupational History  . Not on file  Social Needs  . Financial resource strain: Not on file  . Food insecurity:    Worry: Not on file    Inability: Not on file  . Transportation needs:    Medical: Not on file    Non-medical: Not on file  Tobacco Use  . Smoking status: Never Smoker  . Smokeless  tobacco: Never Used  Substance and Sexual Activity  . Alcohol use: No  . Drug use: No  . Sexual activity: Yes    Birth control/protection: None    Comment: husband vasectomy   Lifestyle  . Physical activity:    Days per week: 0 days    Minutes per session: Not on file  . Stress: Not on file  Relationships  . Social connections:    Talks on phone: Not on file    Gets together: Not on file    Attends religious service: Not on file    Active member of club or organization: Not on file    Attends meetings of clubs or organizations: Not on file    Relationship status: Not on file  Other Topics Concern  . Not on file  Social History Narrative  . Not on file    History reviewed. No pertinent past medical history.   There are no active problems to display for this patient.   Past Surgical History:  Procedure Laterality Date  . CESAREAN SECTION     2007 and 2010    Family History        No family status information on file.        Her family history is not on file.      Allergies  Allergen Reactions  . Other     Certain anesthetics that can  cause Malignant Hyperthermia    family hx of malignant hyperthermia - no personal hx, no problem with c-section or dental procedures in the past NKDA  No current outpatient medications on file.  No meds  Patient Care Team: Donita Brooks, MD as PCP - General (Family Medicine)      Objective:   Vitals: BP 108/70   Pulse 67   Temp 98.3 F (36.8 C) (Oral)   Resp 16   Ht 5\' 3"  (1.6 m)   Wt 144 lb 4 oz (65.4 kg)   LMP 06/05/2018 (Approximate)   SpO2 99%   BMI 25.55 kg/m    Vitals:   06/19/18 0944  BP: 108/70  Pulse: 67  Resp: 16  Temp: 98.3 F (36.8 C)  TempSrc: Oral  SpO2: 99%  Weight: 144 lb 4 oz (65.4 kg)  Height: 5\' 3"  (1.6 m)     Physical Exam Vitals signs and nursing note reviewed.  Constitutional:      General: She is not in acute distress.    Appearance: Normal appearance. She is  well-developed. She is not ill-appearing, toxic-appearing or diaphoretic.  HENT:     Head: Normocephalic and atraumatic.     Right Ear: External ear normal.     Left Ear: External ear normal.     Nose: Nose normal.     Mouth/Throat:     Mouth: Mucous membranes are moist.     Pharynx: Oropharynx is clear. Uvula midline.  Eyes:     General: Lids are normal. No scleral icterus.    Conjunctiva/sclera: Conjunctivae normal.     Pupils: Pupils are equal, round, and reactive to light.  Neck:     Musculoskeletal: Normal range of motion and neck supple.     Trachea: Phonation normal. No tracheal deviation.  Cardiovascular:     Rate and Rhythm: Normal rate and regular rhythm.     Pulses: Normal pulses.          Radial pulses are 2+ on the right side and 2+ on the left side.       Posterior tibial pulses are 2+ on the right side and 2+ on the left side.     Heart sounds: Normal heart sounds. No murmur. No friction rub. No gallop.   Pulmonary:     Effort: Pulmonary effort is normal. No respiratory distress.     Breath sounds: Normal breath sounds. No stridor. No wheezing, rhonchi or rales.  Chest:     Chest wall: No tenderness.  Abdominal:     General: Bowel sounds are normal. There is no distension.     Palpations: Abdomen is soft.     Tenderness: There is no abdominal tenderness. There is no guarding or rebound.  Musculoskeletal: Normal range of motion.        General: No deformity.  Lymphadenopathy:     Cervical: No cervical adenopathy.  Skin:    General: Skin is warm and dry.     Capillary Refill: Capillary refill takes less than 2 seconds.     Coloration: Skin is not pale.     Findings: No rash.  Neurological:     Mental Status: She is alert and oriented to person, place, and time.     Motor: No abnormal muscle tone.     Gait: Gait normal.  Psychiatric:        Mood and Affect: Mood normal.        Speech: Speech normal.  Behavior: Behavior normal.      Depression  Screen PHQ 2/9 Scores 06/19/2018  PHQ - 2 Score 0  PHQ- 9 Score 1     [facility]  AUDIT-C Score  0       Assessment & Plan:     Routine Health Maintenance and Physical Exam  Exercise Activities and Dietary recommendations - increase activity again when able  Discussed health benefits of physical activity, and encouraged her to engage in regular exercise appropriate for her age and condition.    There is no immunization history on file for this patient.  Health Maintenance  Topic Date Due  . HIV Screening  04/02/1999  . TETANUS/TDAP  04/02/2003  . PAP SMEAR-Modifier  04/01/2005  . INFLUENZA VACCINE  05/19/2019 (Originally 01/16/2018)   HIV, PAP and Tdap done with OBGYN - need records    Problem List Items Addressed This Visit    None    Visit Diagnoses    General medical exam    -  Primary   Relevant Orders   CBC with Differential/Platelet   COMPLETE METABOLIC PANEL WITH GFR   Lipid panel   TSH   Pre-op examination        Pre-op H&P form completed - pt cleared for procedure - she has copy of form and is taking to a pre-op appt today.  Copy will be scanned into chart.    Basic labs obtained while pt fasting   Danelle BerryLeisa Roslin Norwood, PA-C 06/19/18 10:26 AM  [facility]

## 2018-06-20 ENCOUNTER — Ambulatory Visit (HOSPITAL_COMMUNITY): Payer: BLUE CROSS/BLUE SHIELD

## 2018-06-20 ENCOUNTER — Ambulatory Visit (HOSPITAL_COMMUNITY)
Admission: RE | Admit: 2018-06-20 | Discharge: 2018-06-20 | Disposition: A | Discharge status: Home / Self Care | Payer: BLUE CROSS/BLUE SHIELD | Attending: Podiatry | Admitting: Podiatry

## 2018-06-20 ENCOUNTER — Ambulatory Visit (HOSPITAL_COMMUNITY): Payer: BLUE CROSS/BLUE SHIELD | Admitting: Vascular Surgery

## 2018-06-20 ENCOUNTER — Encounter: Payer: Self-pay | Admitting: Family Medicine

## 2018-06-20 ENCOUNTER — Encounter (HOSPITAL_COMMUNITY): Payer: Self-pay

## 2018-06-20 ENCOUNTER — Encounter (HOSPITAL_COMMUNITY)
Admission: RE | Disposition: A | Discharge status: Home / Self Care | Payer: Self-pay | Source: Home / Self Care | Attending: Podiatry

## 2018-06-20 DIAGNOSIS — M2012 Hallux valgus (acquired), left foot: Secondary | ICD-10-CM | POA: Diagnosis not present

## 2018-06-20 DIAGNOSIS — M21612 Bunion of left foot: Secondary | ICD-10-CM | POA: Diagnosis not present

## 2018-06-20 DIAGNOSIS — M779 Enthesopathy, unspecified: Secondary | ICD-10-CM | POA: Diagnosis not present

## 2018-06-20 DIAGNOSIS — K219 Gastro-esophageal reflux disease without esophagitis: Secondary | ICD-10-CM | POA: Diagnosis not present

## 2018-06-20 DIAGNOSIS — M21542 Acquired clubfoot, left foot: Secondary | ICD-10-CM | POA: Diagnosis not present

## 2018-06-20 DIAGNOSIS — Z9889 Other specified postprocedural states: Secondary | ICD-10-CM | POA: Diagnosis not present

## 2018-06-20 HISTORY — PX: BUNIONECTOMY: SHX129

## 2018-06-20 HISTORY — DX: Malignant hyperthermia due to anesthesia, initial encounter: T88.3XXA

## 2018-06-20 HISTORY — DX: Family history of other specified conditions: Z84.89

## 2018-06-20 HISTORY — PX: METATARSAL OSTEOTOMY: SHX1641

## 2018-06-20 HISTORY — DX: Gastro-esophageal reflux disease without esophagitis: K21.9

## 2018-06-20 LAB — COMPLETE METABOLIC PANEL WITH GFR
AG Ratio: 1.8 (calc) (ref 1.0–2.5)
ALBUMIN MSPROF: 4.2 g/dL (ref 3.6–5.1)
ALKALINE PHOSPHATASE (APISO): 100 U/L (ref 33–115)
ALT: 23 U/L (ref 6–29)
AST: 19 U/L (ref 10–30)
BILIRUBIN TOTAL: 0.3 mg/dL (ref 0.2–1.2)
BUN: 12 mg/dL (ref 7–25)
CHLORIDE: 107 mmol/L (ref 98–110)
CO2: 22 mmol/L (ref 20–32)
Calcium: 9.1 mg/dL (ref 8.6–10.2)
Creat: 0.81 mg/dL (ref 0.50–1.10)
GFR, Est African American: 110 mL/min/{1.73_m2} (ref >=60)
GFR, Est Non African American: 95 mL/min/{1.73_m2} (ref >=60)
GLOBULIN: 2.4 g/dL (ref 1.9–3.7)
Glucose, Bld: 75 mg/dL (ref 65–99)
POTASSIUM: 4.2 mmol/L (ref 3.5–5.3)
SODIUM: 140 mmol/L (ref 135–146)
Total Protein: 6.6 g/dL (ref 6.1–8.1)

## 2018-06-20 LAB — CBC WITH DIFFERENTIAL/PLATELET
ABSOLUTE MONOCYTES: 414 {cells}/uL (ref 200–950)
Basophils Absolute: 28 cells/uL (ref 0–200)
Basophils Relative: 0.5 %
EOS PCT: 4.3 %
Eosinophils Absolute: 241 cells/uL (ref 15–500)
HCT: 40.2 % (ref 35.0–45.0)
Hemoglobin: 13.9 g/dL (ref 11.7–15.5)
LYMPHS ABS: 1109 {cells}/uL (ref 850–3900)
MCH: 30.5 pg (ref 27.0–33.0)
MCHC: 34.6 g/dL (ref 32.0–36.0)
MCV: 88.2 fL (ref 80.0–100.0)
MPV: 11.1 fL (ref 7.5–12.5)
Monocytes Relative: 7.4 %
NEUTROS PCT: 68 %
Neutro Abs: 3808 cells/uL (ref 1500–7800)
PLATELETS: 232 10*3/uL (ref 140–400)
RBC: 4.56 10*6/uL (ref 3.80–5.10)
RDW: 12.9 % (ref 11.0–15.0)
TOTAL LYMPHOCYTE: 19.8 %
WBC: 5.6 10*3/uL (ref 3.8–10.8)

## 2018-06-20 LAB — LIPID PANEL
CHOLESTEROL: 166 mg/dL (ref <200)
HDL: 73 mg/dL (ref >50)
LDL Cholesterol (Calc): 79 mg/dL (calc)
Non-HDL Cholesterol (Calc): 93 mg/dL (calc) (ref <130)
Total CHOL/HDL Ratio: 2.3 (calc) (ref <5.0)
Triglycerides: 48 mg/dL (ref <150)

## 2018-06-20 LAB — POCT PREGNANCY, URINE: Preg Test, Ur: NEGATIVE

## 2018-06-20 LAB — TSH: TSH: 1.05 mIU/L

## 2018-06-20 SURGERY — BUNIONECTOMY
Anesthesia: Monitor Anesthesia Care | Laterality: Left

## 2018-06-20 MED ORDER — LIDOCAINE HCL (PF) 1 % IJ SOLN
INTRAMUSCULAR | Status: AC
  Filled 2018-06-20: qty 30

## 2018-06-20 MED ORDER — MIDAZOLAM HCL 2 MG/2ML IJ SOLN
INTRAMUSCULAR | Status: AC
  Filled 2018-06-20: qty 2

## 2018-06-20 MED ORDER — PROPOFOL 10 MG/ML IV BOLUS
INTRAVENOUS | Status: DC | PRN
  Administered 2018-06-20 (×3): 20 mg via INTRAVENOUS

## 2018-06-20 MED ORDER — 0.9 % SODIUM CHLORIDE (POUR BTL) OPTIME
TOPICAL | Status: DC | PRN
  Administered 2018-06-20: 1000 mL

## 2018-06-20 MED ORDER — FENTANYL CITRATE (PF) 100 MCG/2ML IJ SOLN: 25.0000 ug | INTRAMUSCULAR | Status: DC | PRN

## 2018-06-20 MED ORDER — CEPHALEXIN 500 MG PO CAPS: 500.0000 mg | ORAL_CAPSULE | Freq: Two times a day (BID) | ORAL | 0 refills | Status: DC

## 2018-06-20 MED ORDER — PROMETHAZINE HCL 25 MG/ML IJ SOLN: 6.2500 mg | INTRAMUSCULAR | Status: DC | PRN

## 2018-06-20 MED ORDER — ONDANSETRON HCL 4 MG/2ML IJ SOLN
INTRAMUSCULAR | Status: DC | PRN
  Administered 2018-06-20: 4 mg via INTRAVENOUS

## 2018-06-20 MED ORDER — LIDOCAINE HCL (PF) 1 % IJ SOLN
INTRAMUSCULAR | Status: DC | PRN
  Administered 2018-06-20: 10 mL

## 2018-06-20 MED ORDER — OXYCODONE-ACETAMINOPHEN 10-325 MG PO TABS: 1.0000 | ORAL_TABLET | ORAL | 0 refills | Status: DC | PRN

## 2018-06-20 MED ORDER — CEFAZOLIN SODIUM-DEXTROSE 2-4 GM/100ML-% IV SOLN
2.0000 g | Freq: Once | INTRAVENOUS | Status: AC
  Administered 2018-06-20: 2 g via INTRAVENOUS
  Filled 2018-06-20: qty 100

## 2018-06-20 MED ORDER — OXYCODONE HCL 5 MG/5ML PO SOLN: 5.0000 mg | Freq: Once | ORAL | Status: DC | PRN

## 2018-06-20 MED ORDER — BUPIVACAINE HCL 0.5 % IJ SOLN
INTRAMUSCULAR | Status: DC | PRN
  Administered 2018-06-20: 10 mL

## 2018-06-20 MED ORDER — PROMETHAZINE HCL 25 MG PO TABS: 25.0000 mg | ORAL_TABLET | Freq: Three times a day (TID) | ORAL | 0 refills | Status: DC | PRN

## 2018-06-20 MED ORDER — FENTANYL CITRATE (PF) 250 MCG/5ML IJ SOLN
INTRAMUSCULAR | Status: AC
  Filled 2018-06-20: qty 5

## 2018-06-20 MED ORDER — PROPOFOL 500 MG/50ML IV EMUL
INTRAVENOUS | Status: DC | PRN
  Administered 2018-06-20: 75 ug/kg/min via INTRAVENOUS

## 2018-06-20 MED ORDER — LACTATED RINGERS IV SOLN
INTRAVENOUS | Status: DC | PRN
  Administered 2018-06-20: 08:00:00 via INTRAVENOUS

## 2018-06-20 MED ORDER — FENTANYL CITRATE (PF) 250 MCG/5ML IJ SOLN
INTRAMUSCULAR | Status: DC | PRN
  Administered 2018-06-20 (×3): 25 ug via INTRAVENOUS

## 2018-06-20 MED ORDER — OXYCODONE HCL 5 MG PO TABS: 5.0000 mg | ORAL_TABLET | Freq: Once | ORAL | Status: DC | PRN

## 2018-06-20 MED ORDER — PROPOFOL 10 MG/ML IV BOLUS
INTRAVENOUS | Status: AC
  Filled 2018-06-20: qty 20

## 2018-06-20 MED ORDER — ACETAMINOPHEN 10 MG/ML IV SOLN: 1000.0000 mg | Freq: Once | INTRAVENOUS | Status: DC | PRN

## 2018-06-20 MED ORDER — BUPIVACAINE HCL (PF) 0.5 % IJ SOLN
INTRAMUSCULAR | Status: AC
  Filled 2018-06-20: qty 30

## 2018-06-20 MED ORDER — MIDAZOLAM HCL 5 MG/5ML IJ SOLN
INTRAMUSCULAR | Status: DC | PRN
  Administered 2018-06-20: 2 mg via INTRAVENOUS

## 2018-06-20 SURGICAL SUPPLY — 40 items
BANDAGE ACE 4X5 VEL STRL LF (GAUZE/BANDAGES/DRESSINGS) ×2 IMPLANT
BANDAGE ELASTIC 4 VELCRO ST LF (GAUZE/BANDAGES/DRESSINGS) ×2 IMPLANT
BLADE LONG MED 31X9 (MISCELLANEOUS) ×2 IMPLANT
BNDG GAUZE ELAST 4 BULKY (GAUZE/BANDAGES/DRESSINGS) ×4 IMPLANT
CLSR STERI-STRIP ANTIMIC 1/2X4 (GAUZE/BANDAGES/DRESSINGS) ×2 IMPLANT
COVER SURGICAL LIGHT HANDLE (MISCELLANEOUS) ×2 IMPLANT
COVER WAND RF STERILE (DRAPES) ×2 IMPLANT
DERMABOND ADHESIVE PROPEN (GAUZE/BANDAGES/DRESSINGS) ×1
DERMABOND ADVANCED .7 DNX6 (GAUZE/BANDAGES/DRESSINGS) ×1 IMPLANT
DRSG EMULSION OIL 3X3 NADH (GAUZE/BANDAGES/DRESSINGS) ×2 IMPLANT
ELECT CAUTERY BLADE 6.4 (BLADE) ×2 IMPLANT
ELECT REM PT RETURN 9FT ADLT (ELECTROSURGICAL) ×2
ELECTRODE REM PT RTRN 9FT ADLT (ELECTROSURGICAL) ×1 IMPLANT
GAUZE SPONGE 4X4 12PLY STRL (GAUZE/BANDAGES/DRESSINGS) ×2 IMPLANT
GLOVE BIO SURGEON STRL SZ7.5 (GLOVE) ×2 IMPLANT
GLOVE BIOGEL PI IND STRL 8 (GLOVE) ×1 IMPLANT
GLOVE BIOGEL PI INDICATOR 8 (GLOVE) ×1
GOWN STRL REUS W/ TWL LRG LVL3 (GOWN DISPOSABLE) ×1 IMPLANT
GOWN STRL REUS W/ TWL XL LVL3 (GOWN DISPOSABLE) ×1 IMPLANT
GOWN STRL REUS W/TWL LRG LVL3 (GOWN DISPOSABLE) ×1
GOWN STRL REUS W/TWL XL LVL3 (GOWN DISPOSABLE) ×1
K-WIRE 3.0 NON-THREADED (WIRE) ×4
KIT BASIN OR (CUSTOM PROCEDURE TRAY) ×2 IMPLANT
KIT TURNOVER KIT B (KITS) ×2 IMPLANT
KWIRE 3.0 NON-THREADED (WIRE) ×2 IMPLANT
NEEDLE HYPO 25GX1X1/2 BEV (NEEDLE) ×2 IMPLANT
NS IRRIG 1000ML POUR BTL (IV SOLUTION) ×2 IMPLANT
PACK ORTHO EXTREMITY (CUSTOM PROCEDURE TRAY) ×2 IMPLANT
PACK VITOSS BIOACTIVE 2.5CC (Neuro Prosthesis/Implant) ×2 IMPLANT
PAD ARMBOARD 7.5X6 YLW CONV (MISCELLANEOUS) ×2 IMPLANT
SCREW HEAD TPR CUT FLUT 16X3X (Screw) ×1 IMPLANT
SCREW HEADLESS 2.5X12MM (Screw) ×2 IMPLANT
SCREW HEADLESS 3.0X16 (Screw) ×1 IMPLANT
SOL PREP POV-IOD 4OZ 10% (MISCELLANEOUS) ×2 IMPLANT
SUT ETHILON 3 0 PS 1 (SUTURE) ×2 IMPLANT
SYR CONTROL 10ML LL (SYRINGE) ×2 IMPLANT
TOWEL OR 17X26 10 PK STRL BLUE (TOWEL DISPOSABLE) ×2 IMPLANT
TUBE CONNECTING 12X1/4 (SUCTIONS) ×2 IMPLANT
WIRE GUIDE 4INX.035 (WIRE) ×2 IMPLANT
YANKAUER SUCT BULB TIP NO VENT (SUCTIONS) ×2 IMPLANT

## 2018-06-20 NOTE — Anesthesia Procedure Notes (Signed)
Procedure Name: MAC Date/Time: 06/20/2018 7:50 AM Performed by: Jearld Pies, CRNA Pre-anesthesia Checklist: Patient identified, Emergency Drugs available, Suction available, Patient being monitored and Timeout performed Patient Re-evaluated:Patient Re-evaluated prior to induction Oxygen Delivery Method: Simple face mask Preoxygenation: Pre-oxygenation with 100% oxygen

## 2018-06-20 NOTE — Brief Op Note (Signed)
06/20/2018  9:19 AM  PATIENT:  Melissa David  33 y.o. female  PRE-OPERATIVE DIAGNOSIS:  HALLUX ABDUCTO VALGUS, CAPSULITIS  POST-OPERATIVE DIAGNOSIS:  HALLUX ABDUCTO VALGUS, CAPSULITIS  PROCEDURE:  Procedure(s): AUSTIN BUNIONECTOMY (Left) METATARSAL OSTEOTOMY 2ND LEFT (Left)  SURGEON:  Surgeon(s) and Role:    [name], DPM - Primary  PHYSICIAN ASSISTANT:   ASSISTANTS: none   ANESTHESIA:   local and MAC  EBL:  5 mL   BLOOD ADMINISTERED:none  DRAINS: none   LOCAL MEDICATIONS USED:  LIDOCAINE and MARCAINE    and Amount: 10 each ml  SPECIMEN:  No Specimen  DISPOSITION OF SPECIMEN:  n/a  COUNTS:  YES  TOURNIQUET:   Total Tourniquet Time Documented: Calf (Left) - 72 minutes Total: Calf (Left) - 72 minutes   DICTATION: .Viviann Spare Dictation  PLAN OF CARE: Discharge to home after PACU  PATIENT DISPOSITION:  PACU - hemodynamically stable.   Delay start of Pharmacological VTE agent (>24hrs) due to surgical blood loss or risk of bleeding: not applicable

## 2018-06-20 NOTE — Interval H&P Note (Signed)
History and Physical Interval Note:  06/20/2018 6:54 AM  Melissa David  has presented today for surgery, with the diagnosis of HALLUX ABDUCTO VALGUS, CAPSULITIS  The various methods of treatment have been discussed with the patient and family. After consideration of risks, benefits and other options for treatment, the patient has consented to  Procedure(s): AUSTIN BUNIONECTOMY (Left) METATARSAL OSTEOTOMY 2ND LEFT (Left) as a surgical intervention .  The patient's history has been reviewed, patient examined, no change in status, stable for surgery.  I have reviewed the patient's chart and labs.  Questions were answered to the patient's satisfaction.     Park Liter

## 2018-06-20 NOTE — Transfer of Care (Signed)
Immediate Anesthesia Transfer of Care Note  Patient: Melissa David  Procedure(s) Performed: [name] (Left ) METATARSAL OSTEOTOMY 2ND LEFT (Left )  Patient Location: PACU  Anesthesia Type:MAC  Level of Consciousness: awake, alert  and oriented  Airway & Oxygen Therapy: Patient Spontanous Breathing on room air  Post-op Assessment: Report given to RN and Post -op Vital signs reviewed and stable  Post vital signs: Reviewed and stable  Last Vitals:  Vitals Value Taken Time  BP 105/58 06/20/2018  9:25 AM  Temp    Pulse 70 06/20/2018  9:26 AM  Resp    SpO2 100 % 06/20/2018  9:26 AM  Vitals shown include unvalidated device data.  Last Pain:  Vitals:   06/20/18 0630  TempSrc:   PainSc: 0-No pain      Patients Stated Pain Goal: 2 (92/42/68 [id number])  Complications: No apparent anesthesia complications

## 2018-06-20 NOTE — Anesthesia Postprocedure Evaluation (Signed)
Anesthesia Post Note  Patient: Melissa David  Procedure(s) Performed: Altamese [place] (Left ) METATARSAL OSTEOTOMY 2ND LEFT (Left )     Patient location during evaluation: PACU Anesthesia Type: MAC Level of consciousness: awake and alert Pain management: pain level controlled Vital Signs Assessment: post-procedure vital signs reviewed and stable Respiratory status: spontaneous breathing, nonlabored ventilation and respiratory function stable Cardiovascular status: blood pressure returned to baseline and stable Postop Assessment: no apparent nausea or vomiting Anesthetic complications: no    Last Vitals:  Vitals:   06/20/18 0955 06/20/18 1014  BP: 110/68 107/62  Pulse: 61 62  Resp: 15   Temp: 36.4 C 36.4 C  SpO2: 99% 100%    Last Pain:  Vitals:   06/20/18 1014  TempSrc:   PainSc: 0-No pain                 Brennan Bailey

## 2018-06-24 ENCOUNTER — Encounter (HOSPITAL_COMMUNITY): Payer: Self-pay | Admitting: Podiatry

## 2018-06-24 ENCOUNTER — Other Ambulatory Visit: Payer: BLUE CROSS/BLUE SHIELD

## 2018-06-26 ENCOUNTER — Ambulatory Visit (INDEPENDENT_AMBULATORY_CARE_PROVIDER_SITE_OTHER): Payer: BLUE CROSS/BLUE SHIELD

## 2018-06-26 ENCOUNTER — Ambulatory Visit (INDEPENDENT_AMBULATORY_CARE_PROVIDER_SITE_OTHER): Payer: BLUE CROSS/BLUE SHIELD | Admitting: Podiatry

## 2018-06-26 DIAGNOSIS — M779 Enthesopathy, unspecified: Secondary | ICD-10-CM | POA: Diagnosis not present

## 2018-06-26 DIAGNOSIS — M21962 Unspecified acquired deformity of left lower leg: Secondary | ICD-10-CM

## 2018-06-26 DIAGNOSIS — M778 Other enthesopathies, not elsewhere classified: Secondary | ICD-10-CM

## 2018-06-26 DIAGNOSIS — M2012 Hallux valgus (acquired), left foot: Secondary | ICD-10-CM

## 2018-06-26 NOTE — Progress Notes (Signed)
  Subjective:  Patient ID: Melissa David, female    DOB: 1986/03/28,  MRN: [id number]  Chief Complaint  Patient presents with  . Routine Post [facility] 01.03.2020 [place] Bunionectomy [place], Metatarsal Osteotomy 2nd [place]     DOS: 06/20/2018 Procedure: Ladene Artist Bunionectomy, 2nd Metatarsal shortening osteotomy  33 y.o. female returns for post-op check. Doing well only having a little pain.  Review of Systems: Negative except as noted in the HPI. Denies N/V/F/Ch.  Past Medical History:  Diagnosis Date  . Family history of adverse reaction to anesthesia    malignant hyperthermia  . GERD (gastroesophageal reflux disease)   . Malignant hyperthermia    06/19/18: Patient's MOTHER has biopsy proven MH. (Patient to inquire about testing for herself and children).    Current Outpatient Medications:  .  cephALEXin (KEFLEX) 500 MG capsule, Take 1 capsule (500 mg total) by mouth 2 (two) times daily., Disp: 14 capsule, Rfl: 0 .  oxyCODONE-acetaminophen (PERCOCET) 10-325 MG tablet, Take 1 tablet by mouth every 4 (four) hours as needed for pain., Disp: 20 tablet, Rfl: 0 .  promethazine (PHENERGAN) 25 MG tablet, Take 1 tablet (25 mg total) by mouth every 8 (eight) hours as needed for nausea or vomiting., Disp: 20 tablet, Rfl: 0  Social History   Tobacco Use  Smoking Status Never Smoker  Smokeless Tobacco Never Used    Allergies  Allergen Reactions  . Other Other (See Comments)    --family hx of malignant hyperthermia - no personal hx, no problem with c-section or dental procedures in the past --[Certain anesthetics that can cause Malignant Hyperthermia  ] NKDA recorded   Objective:  There were no vitals filed for this visit. There is no height or weight on file to calculate BMI. Constitutional Well developed. Well nourished.  Vascular Foot warm and well perfused. Capillary refill normal to all digits.   Neurologic Normal speech. Oriented to person, place, and time. Epicritic sensation to  light touch grossly present bilaterally.  Dermatologic Skin healing well without signs of infection. Skin edges well coapted without signs of infection.  Orthopedic: Tenderness to palpation noted about the surgical site.   Radiographs: Taken and reviewed c/w post-op state HW intact Assessment:   1. Hav (hallux abducto valgus), left   2. Capsulitis of foot, left   3. Metatarsal deformity, left    Plan:  Patient was evaluated and treated and all questions answered.  S/p foot surgery left -Progressing as expected post-operatively. -XR: As above -WB Status: WBAT in CAM boot -Sutures: intact. -Medications: None refilled today. -Foot redressed.  Return for keep current post op appt,  patient.

## 2018-07-04 ENCOUNTER — Ambulatory Visit (INDEPENDENT_AMBULATORY_CARE_PROVIDER_SITE_OTHER): Payer: BLUE CROSS/BLUE SHIELD | Admitting: Podiatry

## 2018-07-04 DIAGNOSIS — M2012 Hallux valgus (acquired), left foot: Secondary | ICD-10-CM

## 2018-07-04 DIAGNOSIS — M21612 Bunion of left foot: Secondary | ICD-10-CM

## 2018-07-04 DIAGNOSIS — M778 Other enthesopathies, not elsewhere classified: Secondary | ICD-10-CM

## 2018-07-04 DIAGNOSIS — M779 Enthesopathy, unspecified: Secondary | ICD-10-CM

## 2018-07-06 NOTE — Progress Notes (Signed)
  Subjective:  Patient ID: Melissa David, female    DOB: [birth year],  MRN: [id number]  Chief Complaint  Patient presents with  . Routine Post [facility] 01.03.2020 [place] Bunionectomy [place], Metatarsal Osteotomy 2nd Lt" my foot is feeling better this week"     DOS: 06/20/2018 Procedure: Ladene Artist Bunionectomy, 2nd Metatarsal shortening osteotomy  33 y.o. female returns for post-op check.  States that her foot is feeling better denies having much pain.  Not taking pain medication.  Review of Systems: Negative except as noted in the HPI. Denies N/V/F/Ch.  Past Medical History:  Diagnosis Date  . Family history of adverse reaction to anesthesia    malignant hyperthermia  . GERD (gastroesophageal reflux disease)   . Malignant hyperthermia    06/19/18: Patient's MOTHER has biopsy proven MH. (Patient to inquire about testing for herself and children).    Current Outpatient Medications:  .  cephALEXin (KEFLEX) 500 MG capsule, Take 1 capsule (500 mg total) by mouth 2 (two) times daily., Disp: 14 capsule, Rfl: 0 .  oxyCODONE-acetaminophen (PERCOCET) 10-325 MG tablet, Take 1 tablet by mouth every 4 (four) hours as needed for pain., Disp: 20 tablet, Rfl: 0 .  promethazine (PHENERGAN) 25 MG tablet, Take 1 tablet (25 mg total) by mouth every 8 (eight) hours as needed for nausea or vomiting., Disp: 20 tablet, Rfl: 0  Social History   Tobacco Use  Smoking Status Never Smoker  Smokeless Tobacco Never Used    Allergies  Allergen Reactions  . Other Other (See Comments)    --family hx of malignant hyperthermia - no personal hx, no problem with c-section or dental procedures in the past --[Certain anesthetics that can cause Malignant Hyperthermia  ] NKDA recorded   Objective:   Vitals:   There is no height or weight on file to calculate BMI. Constitutional Well developed. Well nourished.  Vascular Foot warm and well perfused. Capillary refill normal to all digits.   Neurologic Normal  speech. Oriented to person, place, and time. Epicritic sensation to light touch grossly present bilaterally.  Dermatologic Skin healing well without signs of infection. Skin edges well coapted without signs of infection.  Orthopedic: Tenderness to palpation noted about the surgical site.   Radiographs: None today. Assessment:   1. Hav (hallux abducto valgus), left   2. Hallux valgus with bunions of left foot   3. Capsulitis of foot, left    Plan:  Patient was evaluated and treated and all questions answered.  S/p foot surgery left -Progressing as expected post-operatively. -XR: As above -WB Status: WBAT in CAM boot -Sutures: intact.  Absorbable.  Okay to shower at this point. -Medications: None refilled today. -Foot redressed.  Return in about 2 weeks (around 07/18/2018) for price patient.  New x-rays at that time.

## 2018-07-14 NOTE — Op Note (Signed)
Patient Name: Melissa David DOB: January 09, 1986  MRN: [id number]   Date of Service: 06/21/2018   Surgeon: Dr. Hardie Pulley, DPM Assistants: None Pre-operative Diagnosis: HAV Left Post-operative Diagnosis: same Procedures:             1) [name] Bunionectomy Left  2) Left second metatarsal shortening osteotomy Pathology/Specimens: * No specimens in log * Anesthesia: MAC/regional Hemostasis: Anatomic Estimated Blood Loss: 5 ml Materials: Implant Name Type Inv. Item Serial No. Manufacturer Lot No. LRB No. Used  PACK VITOSS BIOACTIVE 2.5CC - [id number] Neuro Prosthesis/Implant PACK VITOSS BIOACTIVE 2.5CC  [employer] [id number] Left 1  SCREW HEADLESS 3.0X16 - [id number] Screw SCREW HEADLESS 3.0X16  [employer]  Left 1  SCREW HEADLESS 2.5X12MM - [id number] Screw SCREW HEADLESS 2.5X12MM  [employer]  Left 1    Medications: none Complications: None  Indications for Procedure:  This is a 33 y.o. female with a bunion deformity noted to the left foot.  It was discussed the patient should benefit from hallux abducto valgus correction and second metatarsal shortening osteotomy.  Due to family history of malignant hyperthermia it was deemed safest to have her procedure performed at the hospital.  Anesthesia thoroughly prepared for the procedure to be performed under these conditions.   Procedure in Detail: Patient was identified in pre-operative holding area. Formal consent was signed and the left lower extremity was marked. Patient was brought back to the operating room and placed on the operating room table in the supine position. Anesthesia was induced.   The extremity was prepped and draped in the usual sterile fashion. Timeout was taken to confirm patient name, laterality, and procedure prior to incision. Attention was then directed to the left foot where a linear incision was made overlying the first metatarsophalangeal joint. Dissection was carried down through skin and subcutaneous  tissue with care to avoid all vital neurovascular structures all bleeders were cauterized with electrocautery.  A linear incision was then made in the metatarsophalangeal joint capsule and the capsule was gently freed from the bone.  Once the metatarsal head was adequately exposed a sagittal saw was used to resect a portion of the medial eminence.  A chevron osteotomy was then performed with a sagittal saw.  The osteotomy was translated laterally and held temporarily in place with a K wire.  Fluoroscopy was used to check positioning.  Positioning was appropriate.  The osteotomy was then fixated with a Stryker 3.0 screw. The screw did not appear stable with enough bone purchase and was removed.  Vitoss bone graft was mixed and applied to the screw tract.  The K wire was redirected and the osteotomy was again fixated with a 3.0 screw this time with good stability noted.  The incision was then irrigated.  Capsulorrhaphy was performed to assist in capsular correction and the capsule was repaired with 2-0 Vicryl.  The skin was repaired in layers with Monocryl suture.  Attention was then directed to the second metatarsal phalangeal joint where a linear incision was made overlying the second metatarsal phalangeal joint blunt dissection was performed to expose the extensor tendon complex incision was made between the extensor tendons to expose the metatarsal phalangeal joint capsule a linear capsulotomy was performed.  The capsule was gently freed from the bone but the collateral ligaments were left intact.  A Weil osteotomy was performed with the Armada blade parallel to the weightbearing surface.  The osteotomy was allowed to translate back to its natural state this was checked on fluoroscopy and  showed to be in good position.  This was fixated with a 2.5 millimeter screw.  Fluoroscopy was again checked and positioning was adequate.  The capsule was repaired with 2-0 Vicryl and the skin was repaired in layers with  Monocryl.  The foot was then dressed with xeroform, 4x4, kerlix, and ACE bandage. Patient tolerated the procedure well.   Disposition: Following a period of post-operative monitoring, patient will be transferred back home.

## 2018-07-15 ENCOUNTER — Other Ambulatory Visit: Payer: BLUE CROSS/BLUE SHIELD

## 2018-07-17 ENCOUNTER — Ambulatory Visit (INDEPENDENT_AMBULATORY_CARE_PROVIDER_SITE_OTHER): Payer: BLUE CROSS/BLUE SHIELD | Admitting: Podiatry

## 2018-07-17 ENCOUNTER — Ambulatory Visit (INDEPENDENT_AMBULATORY_CARE_PROVIDER_SITE_OTHER): Payer: BLUE CROSS/BLUE SHIELD

## 2018-07-17 DIAGNOSIS — M2012 Hallux valgus (acquired), left foot: Secondary | ICD-10-CM

## 2018-07-17 DIAGNOSIS — Z9889 Other specified postprocedural states: Secondary | ICD-10-CM

## 2018-07-17 NOTE — Progress Notes (Signed)
  Subjective:  Patient ID: Melissa David, female    DOB: 1985-11-03,  MRN: [id number]  Chief Complaint  Patient presents with  . Routine Post Op    pov#2 dos 01.03.2020 [place] Bunionectomy [place], Metatarsal Osteotomy 2nd [place]    DOS: 06/20/2018 Procedure: Ladene Artist Bunionectomy, 2nd Metatarsal shortening osteotomy  33 y.o. female returns for post-op check.  Doing rather well not having any pain ambulating in her boot without issue.  Review of Systems: Negative except as noted in the HPI. Denies N/V/F/Ch.  Past Medical History:  Diagnosis Date  . Family history of adverse reaction to anesthesia    malignant hyperthermia  . GERD (gastroesophageal reflux disease)   . Malignant hyperthermia    06/19/18: Patient's MOTHER has biopsy proven MH. (Patient to inquire about testing for herself and children).    Current Outpatient Medications:  .  cephALEXin (KEFLEX) 500 MG capsule, Take 1 capsule (500 mg total) by mouth 2 (two) times daily., Disp: 14 capsule, Rfl: 0 .  oxyCODONE-acetaminophen (PERCOCET) 10-325 MG tablet, Take 1 tablet by mouth every 4 (four) hours as needed for pain., Disp: 20 tablet, Rfl: 0 .  promethazine (PHENERGAN) 25 MG tablet, Take 1 tablet (25 mg total) by mouth every 8 (eight) hours as needed for nausea or vomiting., Disp: 20 tablet, Rfl: 0  Social History   Tobacco Use  Smoking Status Never Smoker  Smokeless Tobacco Never Used    Allergies  Allergen Reactions  . Other Other (See Comments)    --family hx of malignant hyperthermia - no personal hx, no problem with c-section or dental procedures in the past --[Certain anesthetics that can cause Malignant Hyperthermia  ] NKDA recorded   Objective:   There were no vitals filed for this visit. There is no height or weight on file to calculate BMI. Constitutional Well developed. Well nourished.  Vascular Foot warm and well perfused. Capillary refill normal to all digits.   Neurologic Normal speech. Oriented to  person, place, and time. Epicritic sensation to light touch grossly present bilaterally.  Dermatologic Skin well-healed.  Orthopedic: Tenderness to palpation noted about the surgical site.   Radiographs: Taken and reviewed shows healing of the osteotomy sites without evidence of hardware failure or dislocation Assessment:   1. Hav (hallux abducto valgus), left   2. Post-operative state    Plan:  Patient was evaluated and treated and all questions answered.  S/p foot surgery left -Progressing as expected post-operatively.  -XR: As above -WB Status: WBAT in surgical shoe. -Medications: None refilled today. -Foot redressed. -Advised to work on range of motion exercises  Return in about 2 weeks (around 07/31/2018) for XRs, Post-op.  New x-rays at that time.  We will plan to transition to normal shoe gear at that time

## 2018-07-29 ENCOUNTER — Other Ambulatory Visit: Payer: BLUE CROSS/BLUE SHIELD

## 2018-07-31 ENCOUNTER — Encounter: Payer: Self-pay | Admitting: Podiatry

## 2018-07-31 ENCOUNTER — Ambulatory Visit (INDEPENDENT_AMBULATORY_CARE_PROVIDER_SITE_OTHER): Payer: BLUE CROSS/BLUE SHIELD | Admitting: Podiatry

## 2018-07-31 ENCOUNTER — Ambulatory Visit (INDEPENDENT_AMBULATORY_CARE_PROVIDER_SITE_OTHER): Payer: BLUE CROSS/BLUE SHIELD

## 2018-07-31 DIAGNOSIS — Z9889 Other specified postprocedural states: Secondary | ICD-10-CM

## 2018-07-31 DIAGNOSIS — M2012 Hallux valgus (acquired), left foot: Secondary | ICD-10-CM

## 2018-08-01 ENCOUNTER — Telehealth: Payer: Self-pay | Admitting: Family Medicine

## 2018-08-01 MED ORDER — OSELTAMIVIR PHOSPHATE 75 MG PO CAPS: 75.0000 mg | ORAL_CAPSULE | Freq: Every day | ORAL | 0 refills | Status: DC

## 2018-08-01 MED ORDER — OSELTAMIVIR PHOSPHATE 75 MG PO CAPS: 75.0000 mg | ORAL_CAPSULE | Freq: Every day | ORAL | 0 refills | Status: AC

## 2018-08-01 NOTE — Telephone Encounter (Signed)
Daughter tested positive for the flu at an urgent care, would like to know if tamiflu can be called in for her  [phone] [facility] [address]

## 2018-08-01 NOTE — Telephone Encounter (Signed)
Per Dr. Tanya Nones ok to call out tamiflu preventive dose.   Med sent to pharm and pt aware

## 2018-08-12 ENCOUNTER — Encounter: Payer: Self-pay | Admitting: Podiatry

## 2018-08-28 ENCOUNTER — Ambulatory Visit (INDEPENDENT_AMBULATORY_CARE_PROVIDER_SITE_OTHER): Payer: BLUE CROSS/BLUE SHIELD

## 2018-08-28 ENCOUNTER — Ambulatory Visit (INDEPENDENT_AMBULATORY_CARE_PROVIDER_SITE_OTHER): Payer: BLUE CROSS/BLUE SHIELD | Admitting: Podiatry

## 2018-08-28 ENCOUNTER — Other Ambulatory Visit: Payer: Self-pay

## 2018-08-28 DIAGNOSIS — M2012 Hallux valgus (acquired), left foot: Secondary | ICD-10-CM

## 2018-08-28 NOTE — Progress Notes (Signed)
  Subjective:  Patient ID: Melissa David, female    DOB: Feb 15, 1986,  MRN: [id number]  No chief complaint on file.   DOS: 06/20/2018 Procedure: Ladene Artist Bunionectomy, 2nd Metatarsal shortening osteotomy  33 y.o. female returns for post-op check. Doing well not having any pain in the left foot  Review of Systems: Negative except as noted in the HPI. Denies N/V/F/Ch.  Past Medical History:  Diagnosis Date  . Family history of adverse reaction to anesthesia    malignant hyperthermia  . GERD (gastroesophageal reflux disease)   . Malignant hyperthermia    06/19/18: Patient's MOTHER has biopsy proven MH. (Patient to inquire about testing for herself and children).    Current Outpatient Medications:  .  cephALEXin (KEFLEX) 500 MG capsule, Take 1 capsule (500 mg total) by mouth 2 (two) times daily., Disp: 14 capsule, Rfl: 0 .  oxyCODONE-acetaminophen (PERCOCET) 10-325 MG tablet, Take 1 tablet by mouth every 4 (four) hours as needed for pain., Disp: 20 tablet, Rfl: 0 .  promethazine (PHENERGAN) 25 MG tablet, Take 1 tablet (25 mg total) by mouth every 8 (eight) hours as needed for nausea or vomiting., Disp: 20 tablet, Rfl: 0  Social History   Tobacco Use  Smoking Status Never Smoker  Smokeless Tobacco Never Used    Allergies  Allergen Reactions  . Other Other (See Comments)    --family hx of malignant hyperthermia - no personal hx, no problem with c-section or dental procedures in the past --[Certain anesthetics that can cause Malignant Hyperthermia  ] NKDA recorded   Objective:   There were no vitals filed for this visit. There is no height or weight on file to calculate BMI. Constitutional Well developed. Well nourished.  Vascular Foot warm and well perfused. Capillary refill normal to all digits.   Neurologic Normal speech. Oriented to person, place, and time. Epicritic sensation to light touch grossly present bilaterally.  Dermatologic Skin well-healed.  Orthopedic: No  tenderness to palpation noted about the surgical site.   Radiographs: Taken and reviewed osteotomies healed. Assessment:   1. Hav (hallux abducto valgus), left   2. Post-operative state    Plan:  Patient was evaluated and treated and all questions answered.  S/p foot surgery left -Progressing as expected post-operatively.  -XR: As above -WB Status: WBAT in normal shoegear -Medications: None refilled today.  Return in about 4 weeks (around 08/28/2018) for XRs, Post-op.  New x-rays at that time.

## 2018-09-30 NOTE — Progress Notes (Signed)
  Subjective:  Patient ID: Melissa David, female    DOB: 08-19-1985,  MRN: [id number]  Chief Complaint  Patient presents with  . Routine Post Op    POV#4 DOS 1.03.2020 [place] Bunionectomy left MT Osteotomy 2nd left. Pt states healing well. Pt states left 2nd toe has pain when flexed dorsal. No other concerns. Pt denies fever/nausea/vomiting/chills.   DOS: 06/20/2018 Procedure: Ladene Artist Bunionectomy, 2nd Metatarsal shortening osteotomy  33 y.o. female returns for post-op check.  Only having some pain at the second toe area.  Able to tolerate normal shoes without issues  Review of Systems: Negative except as noted in the HPI. Denies N/V/F/Ch.  Past Medical History:  Diagnosis Date  . Family history of adverse reaction to anesthesia    malignant hyperthermia  . GERD (gastroesophageal reflux disease)   . Malignant hyperthermia    06/19/18: Patient's MOTHER has biopsy proven MH. (Patient to inquire about testing for herself and children).    Current Outpatient Medications:  .  cephALEXin (KEFLEX) 500 MG capsule, Take 1 capsule (500 mg total) by mouth 2 (two) times daily., Disp: 14 capsule, Rfl: 0 .  oxyCODONE-acetaminophen (PERCOCET) 10-325 MG tablet, Take 1 tablet by mouth every 4 (four) hours as needed for pain., Disp: 20 tablet, Rfl: 0 .  promethazine (PHENERGAN) 25 MG tablet, Take 1 tablet (25 mg total) by mouth every 8 (eight) hours as needed for nausea or vomiting., Disp: 20 tablet, Rfl: 0  Social History   Tobacco Use  Smoking Status Never Smoker  Smokeless Tobacco Never Used    Allergies  Allergen Reactions  . Other Other (See Comments)    --family hx of malignant hyperthermia - no personal hx, no problem with c-section or dental procedures in the past --[Certain anesthetics that can cause Malignant Hyperthermia  ] NKDA recorded   Objective:   There were no vitals filed for this visit. There is no height or weight on file to calculate BMI. Constitutional Well developed.  Well nourished.  Vascular Foot warm and well perfused. Capillary refill normal to all digits.   Neurologic Normal speech. Oriented to person, place, and time. Epicritic sensation to light touch grossly present bilaterally.  Dermatologic Skin well-healed.  Orthopedic: No tenderness to palpation noted about the surgical site.   Radiographs: Taken and reviewed.  Osteotomy is again appear healed good position noted Assessment:   1. Hav (hallux abducto valgus), left    Plan:  Patient was evaluated and treated and all questions answered.  S/p foot surgery left -Progressing as expected post-operatively.  -XR: As above -Work on stretching and range of motion exercises the second MPJ including taping the toe and plantar flexion -WB Status: WBAT in normal shoegear -Medications: None refilled today.  Return if symptoms worsen or fail to improve.

## 2019-04-19 NOTE — Progress Notes (Signed)
PCP:  Donita BrooksPickard, Warren T, MD   Chief Complaint  Patient presents with  . Gynecologic Exam    noticed a lump on right breast, tender at times, noticed about a month ago right before last cycle started     HPI:      Ms. Melissa David is a 33 y.o. No obstetric history on file. who LMP was Patient's last menstrual period was 04/04/2019 (exact date)., presents today for her NP annual examination.  Her menses are Q3 1/2-4 wks, lasting 6-7 days, heavy flow for ~3 days, changing super tampons Q 1 hr, with quarter sized to 1/2 dollar sized clots. Flow heavier since birth of last daughter.  Dysmenorrhea mild, improved with NSAIDs. She does not have intermenstrual bleeding. Hx of migraines with aura, no hx of HTN, DVTs.  Sex activity: single partner, contraception - vasectomy.  Last Pap: Not recent; no hx of abn  Last mammogram: never There is no FH of breast cancer. There is no FH of ovarian cancer. The patient does do self-breast exams. She noticed a RT breast mass about a wk before LMP. Area around mass is tender but mass isn't bothersome. No change in size, no erythema/trauma/nipple d/c. No previous mammo.   Tobacco use: The patient denies current or previous tobacco use. Alcohol use: none No drug use.  Exercise: very active  She does get adequate calcium but not Vitamin D in her diet. Labs with PCP.   Past Medical History:  Diagnosis Date  . Family history of adverse reaction to anesthesia    malignant hyperthermia  . GERD (gastroesophageal reflux disease)   . Malignant hyperthermia    06/19/18: Patient's MOTHER has biopsy proven MH. (Patient to inquire about testing for herself and children).  . Ovarian cyst      Past Surgical History:  Procedure Laterality Date  . BUNIONECTOMY Left 06/20/2018   Procedure: [facility] BUNIONECTOMY;  Surgeon: Park LiterPrice, Michael J, DPM;  [facility];  Service: Podiatry;  Laterality: Left;  . CESAREAN SECTION     2007 and 2010  . METATARSAL OSTEOTOMY  Left 06/20/2018   Procedure: METATARSAL OSTEOTOMY 2ND LEFT;  Surgeon: Park LiterPrice, Michael J, DPM;  Location: [facility] OR;  Service: Podiatry;  Laterality: Left;  . WISDOM TOOTH EXTRACTION      Family History  Problem Relation Age of Onset  . Breast cancer Neg Hx   . Ovarian cancer Neg Hx     Social History   Socioeconomic History  . Marital status: Married    Spouse name: Not on file  . Number of children: Not on file  . Years of education: Not on file  . Highest education level: Not on file  Occupational History  . Not on file  Social Needs  . Financial resource strain: Not on file  . Food insecurity    Worry: Not on file    Inability: Not on file  . Transportation needs    Medical: Not on file    Non-medical: Not on file  Tobacco Use  . Smoking status: Never Smoker  . Smokeless tobacco: Never Used  Substance and Sexual Activity  . Alcohol use: No  . Drug use: No  . Sexual activity: Yes    Birth control/protection: None    Comment: husband vasectomy   Lifestyle  . Physical activity    Days per week: 0 days    Minutes per session: Not on file  . Stress: Not on file  Relationships  . Social connections  Talks on phone: Not on file    Gets together: Not on file    Attends religious service: Not on file    Active member of club or organization: Not on file    Attends meetings of clubs or organizations: Not on file    Relationship status: Not on file  . Intimate partner violence    Fear of current or ex partner: Not on file    Emotionally abused: Not on file    Physically abused: Not on file    Forced sexual activity: Not on file  Other Topics Concern  . Not on file  Social History Narrative  . Not on file   NO CURRENT MEDS  ROS:  Review of Systems  Constitutional: Negative for fatigue, fever and unexpected weight change.  Respiratory: Negative for cough, shortness of breath and wheezing.   Cardiovascular: Negative for chest pain, palpitations and leg swelling.   Gastrointestinal: Negative for blood in stool, constipation, diarrhea, nausea and vomiting.  Endocrine: Negative for cold intolerance, heat intolerance and polyuria.  Genitourinary: Negative for dyspareunia, dysuria, flank pain, frequency, genital sores, hematuria, menstrual problem, pelvic pain, urgency, vaginal bleeding, vaginal discharge and vaginal pain.  Musculoskeletal: Negative for back pain, joint swelling and myalgias.  Skin: Negative for rash.  Neurological: Negative for dizziness, syncope, light-headedness, numbness and headaches.  Hematological: Negative for adenopathy.  Psychiatric/Behavioral: Negative for agitation, confusion, sleep disturbance and suicidal ideas. The patient is not nervous/anxious.    BREAST: mass   Objective: BP 118/80   Ht 5\' 3"  (1.6 m)   Wt 142 lb (64.4 kg)   LMP 04/04/2019 (Exact Date)   BMI 25.15 kg/m    Physical Exam Constitutional:      Appearance: She is well-developed.  Genitourinary:     Vulva, vagina, cervix, uterus, right adnexa and left adnexa normal.     No vulval lesion or tenderness noted.     No vaginal discharge, erythema or tenderness.     No cervical polyp.     Uterus is not enlarged or tender.     No right or left adnexal mass present.     Right adnexa not tender.     Left adnexa not tender.  Neck:     Musculoskeletal: Normal range of motion.     Thyroid: No thyromegaly.  Cardiovascular:     Rate and Rhythm: Normal rate and regular rhythm.     Heart sounds: Normal heart sounds. No murmur.  Pulmonary:     Effort: Pulmonary effort is normal.     Breath sounds: Normal breath sounds.  Chest:     Breasts:        Right: No mass, nipple discharge, skin change or tenderness.        Left: No mass, nipple discharge, skin change or tenderness.    Abdominal:     Palpations: Abdomen is soft.     Tenderness: There is no abdominal tenderness. There is no guarding.  Musculoskeletal: Normal range of motion.  Neurological:      General: No focal deficit present.     Mental Status: She is alert and oriented to person, place, and time.     Cranial Nerves: No cranial nerve deficit.  Skin:    General: Skin is warm and dry.  Psychiatric:        Mood and Affect: Mood normal.        Behavior: Behavior normal.        Thought Content: Thought content normal.  Judgment: Judgment normal.  Vitals signs reviewed.     Results: Results for orders placed or performed in visit on 04/20/19 (from the past 24 hour(s))  POCT hemoglobin     Status: Normal   Collection Time: 04/20/19  2:34 PM  Result Value Ref Range   Hemoglobin 14.6 11 - 14.6 g/dL    Assessment/Plan: Encounter for annual routine gynecological examination  Cervical cancer screening - Plan: Cytology - PAP  Screening for HPV (human papillomavirus) - Plan: Cytology - PAP  Breast mass, right - Plan: MM DIAG BREAST TOMO BILATERAL, US BREAST LTD UNI RIGHT INC AXILLA; RT breast for ~1 month. Check dx mammo and u/s. Will f/u with results. If neg, most likely prominent tissue.   Menorrhagia with regular cycle - Plan: POCT hemoglobin, tranexamic acid (LYSTEDA) 650 MG TABS tablet; Normal HgB. Discussed tx options including prog only options (due to migraine with aura hx), IUD, ablation, lysteda. Pt would like to try lysteda. Rx eRxd. F/u prn.   Meds ordered this encounter  Medications  . tranexamic acid (LYSTEDA) 650 MG TABS tablet    Sig: Take 2 tablets (1,300 mg total) by mouth 3 (three) times daily. Take during menses for a maximum of five days    Dispense:  30 tablet    Refill:  11    Order Specific Question:   Supervising Provider    Answer:   Gae Dry [[id number]]             GYN counsel breast self exam, mammography screening, adequate intake of calcium and vitamin D, diet and exercise     F/U  Return in about 1 year (around 04/19/2020).  Alicia B. Copland, PA-C 04/20/2019 2:50 PM

## 2019-04-20 ENCOUNTER — Encounter: Payer: Self-pay | Admitting: Obstetrics and Gynecology

## 2019-04-20 ENCOUNTER — Ambulatory Visit (INDEPENDENT_AMBULATORY_CARE_PROVIDER_SITE_OTHER): Payer: 59 | Admitting: Obstetrics and Gynecology

## 2019-04-20 ENCOUNTER — Other Ambulatory Visit (HOSPITAL_COMMUNITY)
Admission: RE | Admit: 2019-04-20 | Discharge: 2019-04-20 | Disposition: A | Discharge status: Home / Self Care | Payer: 59 | Source: Ambulatory Visit | Attending: Obstetrics and Gynecology | Admitting: Obstetrics and Gynecology

## 2019-04-20 ENCOUNTER — Other Ambulatory Visit: Payer: Self-pay

## 2019-04-20 VITALS — BP 118/80 | Ht 63.0 in | Wt 142.0 lb

## 2019-04-20 DIAGNOSIS — N920 Excessive and frequent menstruation with regular cycle: Secondary | ICD-10-CM | POA: Diagnosis not present

## 2019-04-20 DIAGNOSIS — Z1151 Encounter for screening for human papillomavirus (HPV): Secondary | ICD-10-CM

## 2019-04-20 DIAGNOSIS — Z124 Encounter for screening for malignant neoplasm of cervix: Secondary | ICD-10-CM

## 2019-04-20 DIAGNOSIS — N63 Unspecified lump in unspecified breast: Secondary | ICD-10-CM | POA: Insufficient documentation

## 2019-04-20 DIAGNOSIS — R69 Illness, unspecified: Secondary | ICD-10-CM | POA: Diagnosis not present

## 2019-04-20 DIAGNOSIS — Z01419 Encounter for gynecological examination (general) (routine) without abnormal findings: Secondary | ICD-10-CM

## 2019-04-20 DIAGNOSIS — N6310 Unspecified lump in the right breast, unspecified quadrant: Secondary | ICD-10-CM

## 2019-04-20 LAB — POCT HEMOGLOBIN: Hemoglobin: 14.6 g/dL (ref 11–14.6)

## 2019-04-20 MED ORDER — TRANEXAMIC ACID 650 MG PO TABS: 1300.0000 mg | ORAL_TABLET | Freq: Three times a day (TID) | ORAL | 11 refills | Status: DC

## 2019-04-20 NOTE — Patient Instructions (Signed)
I value your feedback and entrusting us with your care. If you get a [facility] patient survey, I would appreciate you taking the time to let us know about your experience today. Thank you! 

## 2019-04-23 ENCOUNTER — Telehealth: Payer: Self-pay | Admitting: Obstetrics and Gynecology

## 2019-04-23 NOTE — Telephone Encounter (Signed)
Called and spoke with patient she is aware of her appointment scheduled at [facility] on Wed April 29, 2019 @ 10:40am.

## 2019-04-24 LAB — CYTOLOGY - PAP
Adequacy: ABSENT
Comment: NEGATIVE
Diagnosis: NEGATIVE
High risk HPV: NEGATIVE

## 2019-04-29 ENCOUNTER — Ambulatory Visit
Admission: RE | Admit: 2019-04-29 | Discharge: 2019-04-29 | Disposition: A | Discharge status: Home / Self Care | Payer: 59 | Source: Ambulatory Visit | Attending: Obstetrics and Gynecology | Admitting: Obstetrics and Gynecology

## 2019-04-29 DIAGNOSIS — J029 Acute pharyngitis, unspecified: Secondary | ICD-10-CM | POA: Diagnosis not present

## 2019-04-29 DIAGNOSIS — N6489 Other specified disorders of breast: Secondary | ICD-10-CM | POA: Diagnosis not present

## 2019-04-29 DIAGNOSIS — N6310 Unspecified lump in the right breast, unspecified quadrant: Secondary | ICD-10-CM

## 2019-04-29 DIAGNOSIS — Z20828 Contact with and (suspected) exposure to other viral communicable diseases: Secondary | ICD-10-CM | POA: Diagnosis not present

## 2019-04-29 DIAGNOSIS — R922 Inconclusive mammogram: Secondary | ICD-10-CM | POA: Diagnosis not present

## 2020-01-26 IMAGING — US US BREAST*R* LIMITED INC AXILLA
1 series · 2 of 2 positions shown · non-contrast
Comparison: Previous exam(s).

CLINICAL DATA: Patient presents for palpable abnormality within the
upper-outer right breast.

EXAM:
DIGITAL DIAGNOSTIC BILATERAL MAMMOGRAM WITH CAD AND TOMO
ULTRASOUND RIGHT BREAST

[Series 1: us breast*right* limited inc axilla · 0.07mm/px · 2 of 2 slices shown]
[im 1/2]
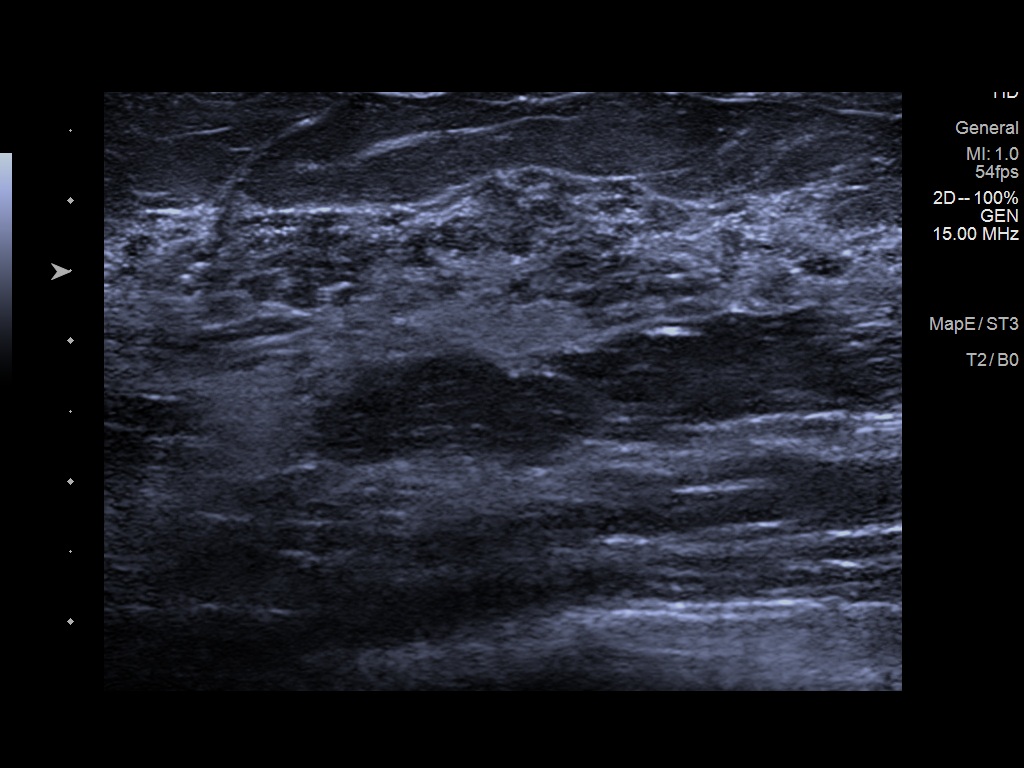
[im 2/2]
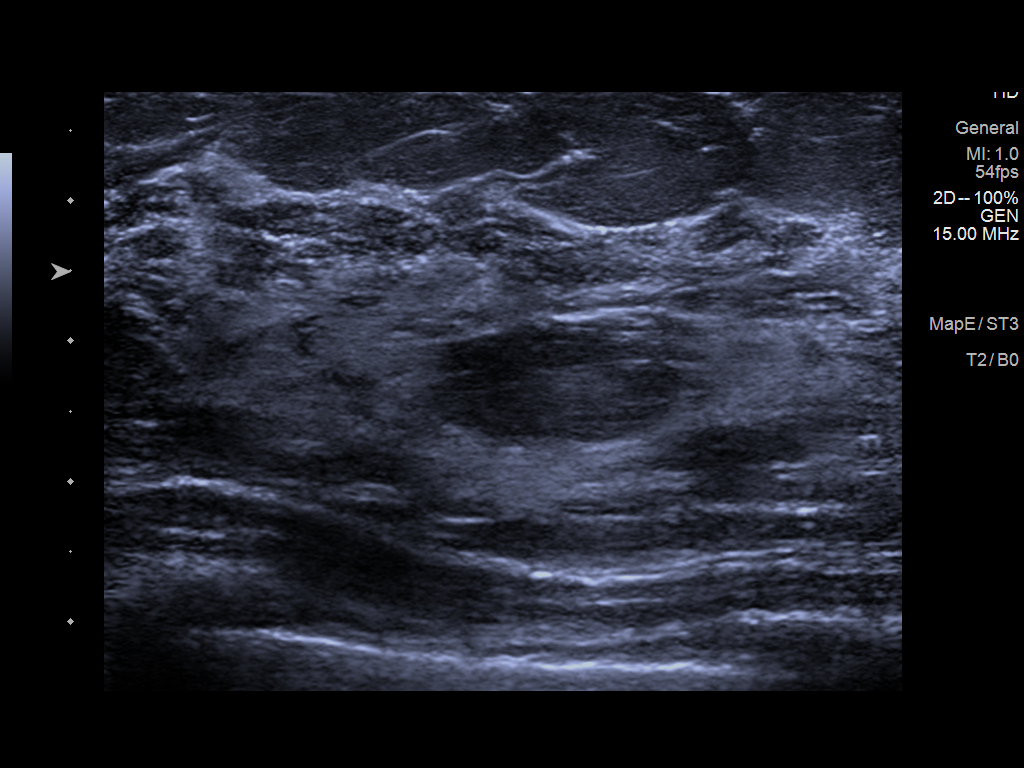

[2 of 2 positions shown; findings below may reference images not displayed]

ACR Breast Density Category c: The breast tissue is heterogeneously
dense, which may obscure small masses.
FINDINGS: No concerning masses, calcifications or distortion identified within
either breast.

Mammographic images were processed with CAD.

On physical exam, dense tissue is palpated within the superior right
breast.

Targeted ultrasound is performed, showing dense fibroglandular
tissue without suspicious mass right breast 11 o'clock position 6 cm
from nipple at the site of palpable concern.
IMPRESSION: No mammographic evidence for malignancy.

Palpable abnormality corresponds with a dense area of fibroglandular
tissue.

RECOMMENDATION:
Continued clinical evaluation for right breast palpable abnormality.

Screening mammogram at age 38 unless there are persistent or
intervening clinical concerns. (Code:N4-F-0GN)

I have discussed the findings and recommendations with the patient.
If applicable, a reminder letter will be sent to the patient
regarding the next appointment.

BI-RADS CATEGORY  1: Negative.

## 2020-01-26 IMAGING — MG DIGITAL DIAGNOSTIC BILAT W/ TOMO W/ CAD
8 of 14 series · 8 of 40 positions shown · non-contrast
Comparison: Previous exam(s).

CLINICAL DATA: Patient presents for palpable abnormality within the
upper-outer right breast.

EXAM:
DIGITAL DIAGNOSTIC BILATERAL MAMMOGRAM WITH CAD AND TOMO
ULTRASOUND RIGHT BREAST

[R MLO synth-2D (1 of 2)]
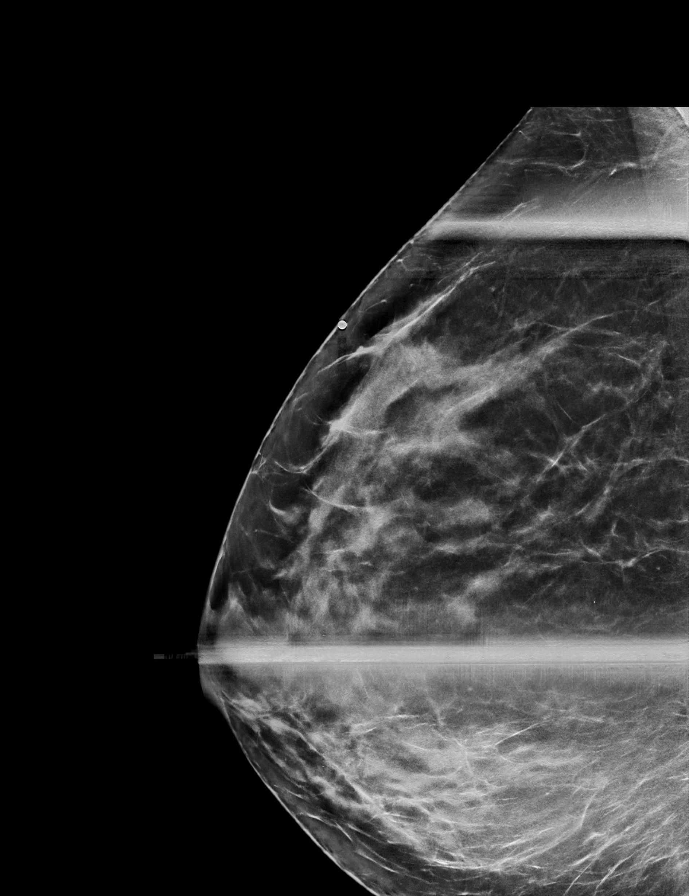

[L CC synth-2D]
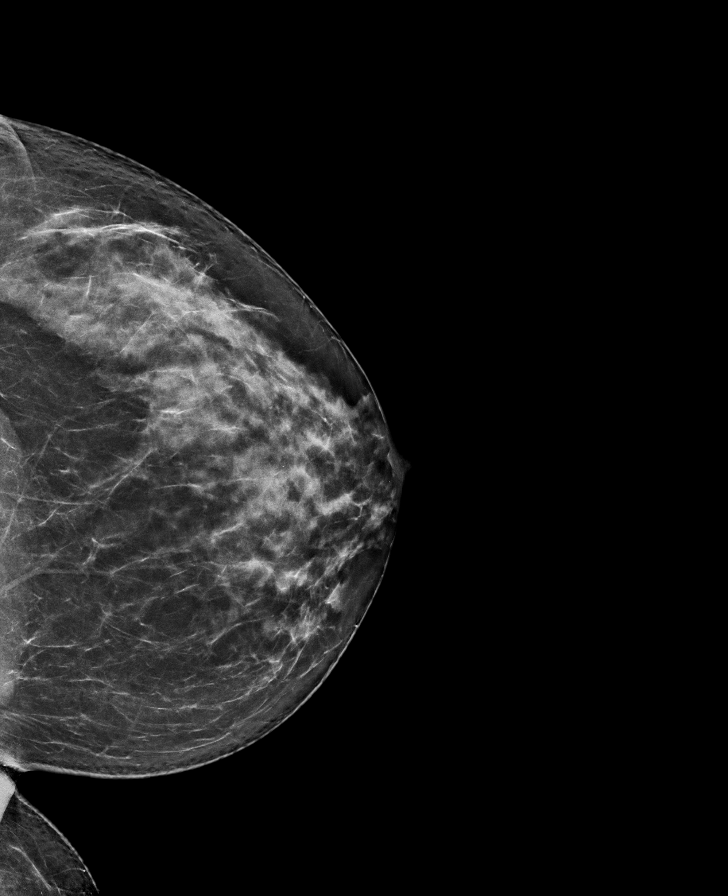

[L MLO synth-2D]
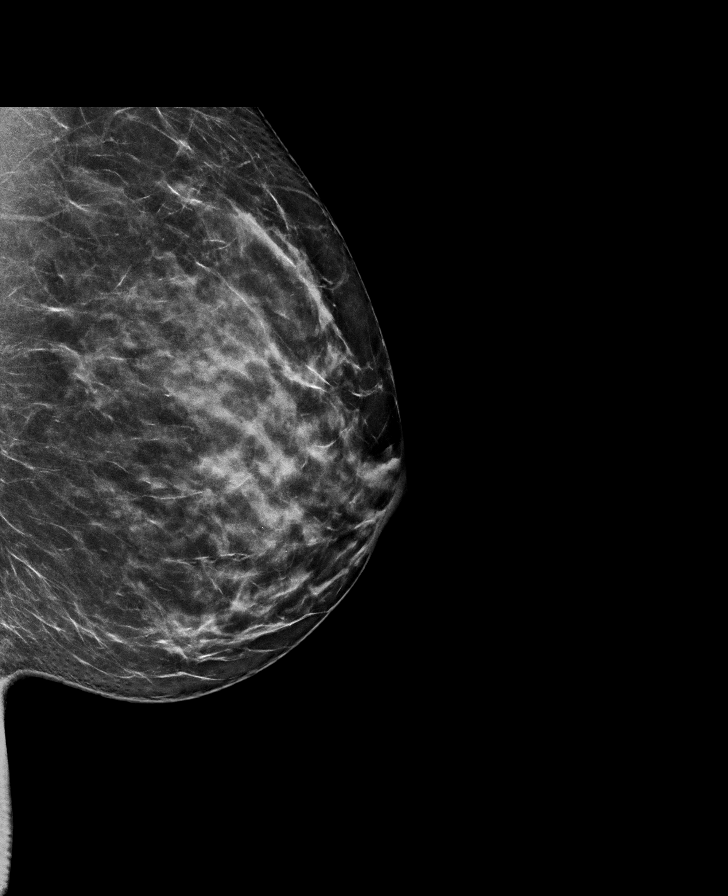

[R ML synth-2D (1 of 2)]
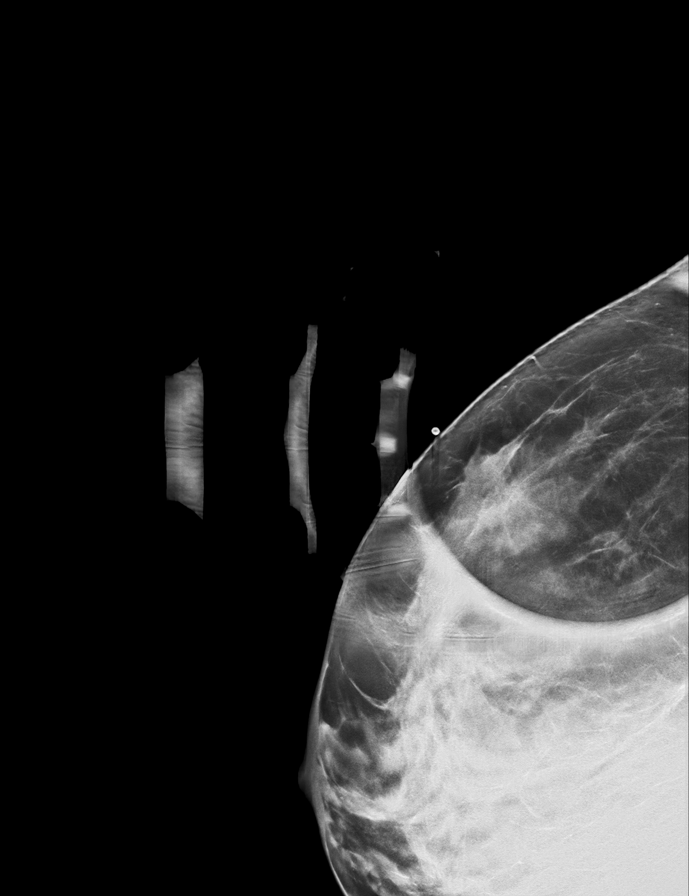

[R ML synth-2D (2 of 2)]
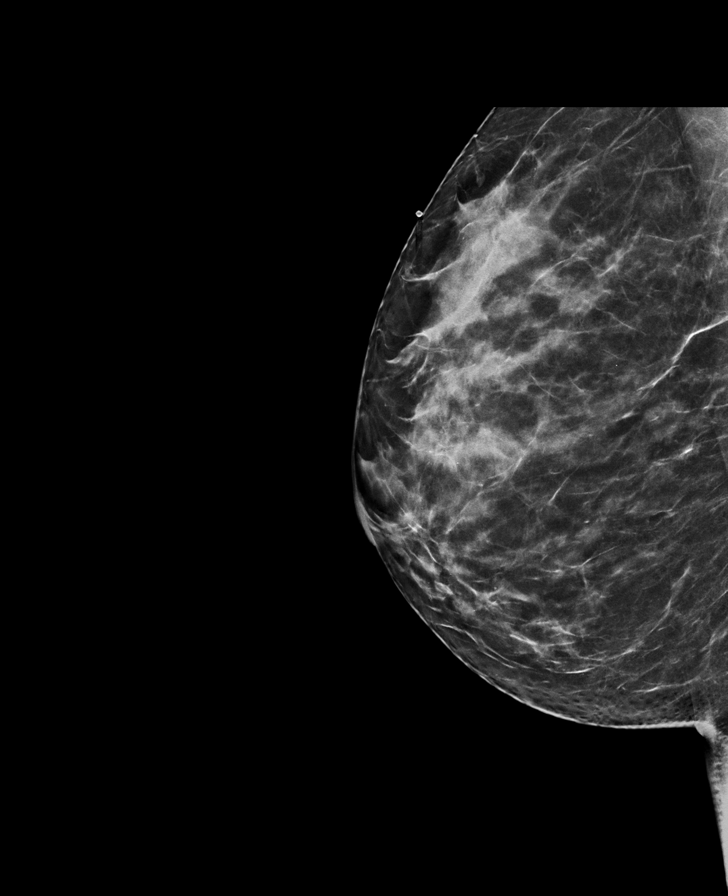

[R CC synth-2D]
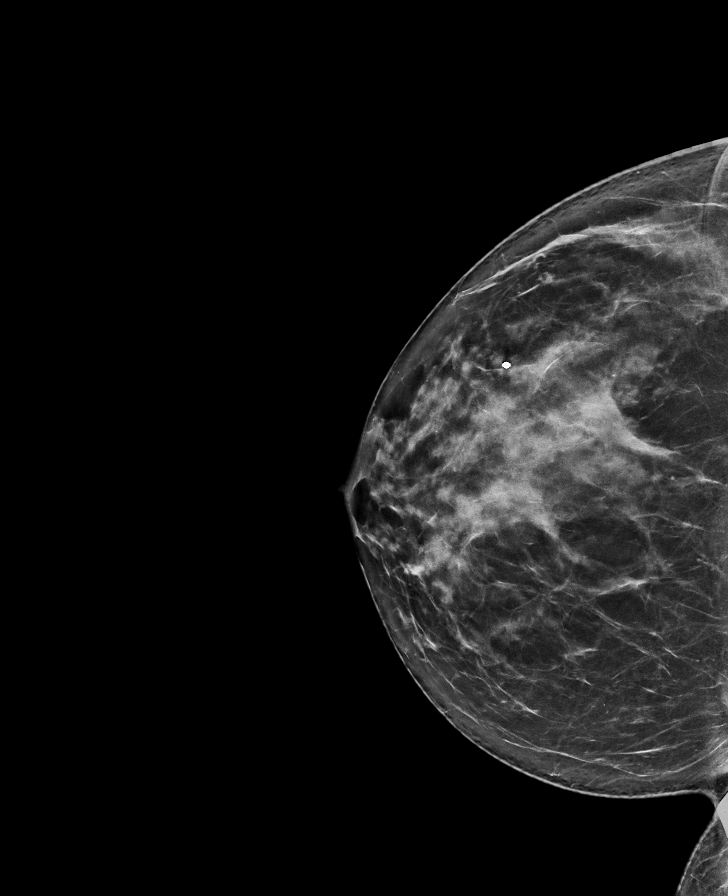

[R MLO synth-2D (2 of 2)]
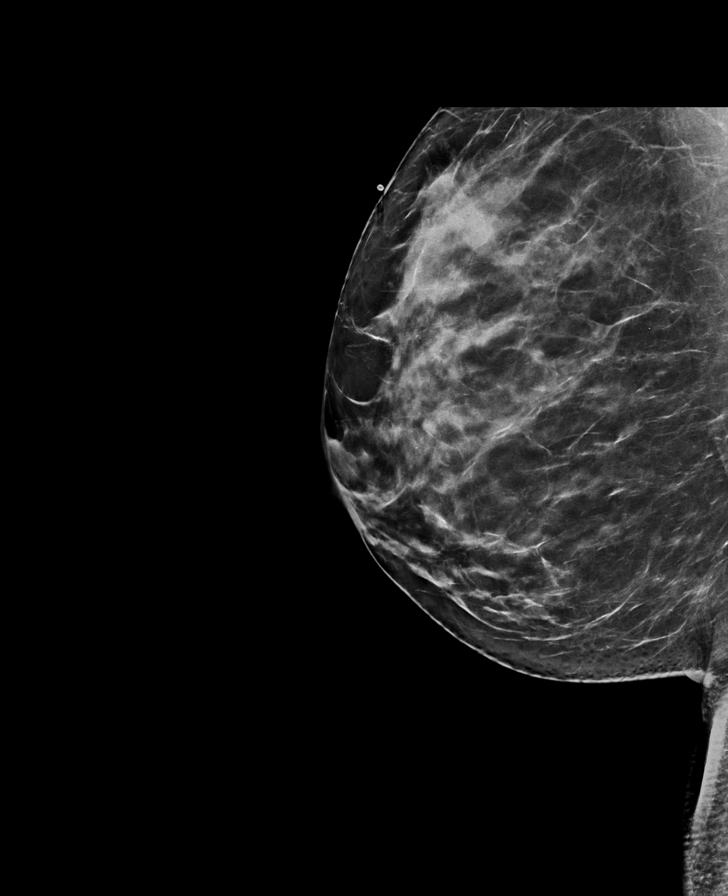

[R ML tomo · tomo slice 32/63.0]
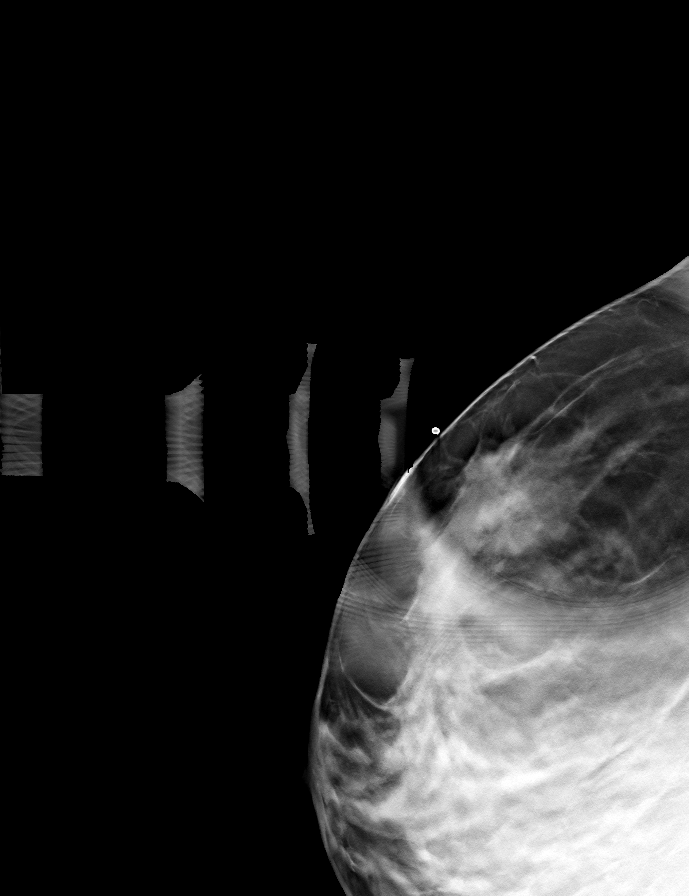

[8 of 40 positions shown; findings below may reference images not displayed]

ACR Breast Density Category c: The breast tissue is heterogeneously
dense, which may obscure small masses.
FINDINGS: No concerning masses, calcifications or distortion identified within
either breast.

Mammographic images were processed with CAD.

On physical exam, dense tissue is palpated within the superior right
breast.

Targeted ultrasound is performed, showing dense fibroglandular
tissue without suspicious mass right breast 11 o'clock position 6 cm
from nipple at the site of palpable concern.
IMPRESSION: No mammographic evidence for malignancy.

Palpable abnormality corresponds with a dense area of fibroglandular
tissue.

RECOMMENDATION:
Continued clinical evaluation for right breast palpable abnormality.

Screening mammogram at age 38 unless there are persistent or
intervening clinical concerns. (Code:N4-F-0GN)

I have discussed the findings and recommendations with the patient.
If applicable, a reminder letter will be sent to the patient
regarding the next appointment.

BI-RADS CATEGORY  1: Negative.

## 2020-04-25 ENCOUNTER — Ambulatory Visit (INDEPENDENT_AMBULATORY_CARE_PROVIDER_SITE_OTHER): Payer: 59 | Admitting: Obstetrics and Gynecology

## 2020-04-25 ENCOUNTER — Other Ambulatory Visit: Payer: Self-pay

## 2020-04-25 ENCOUNTER — Encounter: Payer: Self-pay | Admitting: Obstetrics and Gynecology

## 2020-04-25 VITALS — BP 100/70 | Ht 63.0 in | Wt 138.0 lb

## 2020-04-25 DIAGNOSIS — Z1329 Encounter for screening for other suspected endocrine disorder: Secondary | ICD-10-CM | POA: Diagnosis not present

## 2020-04-25 DIAGNOSIS — E049 Nontoxic goiter, unspecified: Secondary | ICD-10-CM

## 2020-04-25 DIAGNOSIS — N920 Excessive and frequent menstruation with regular cycle: Secondary | ICD-10-CM | POA: Diagnosis not present

## 2020-04-25 DIAGNOSIS — Z130 Encounter for screening for diseases of the blood and blood-forming organs and certain disorders involving the immune mechanism: Secondary | ICD-10-CM

## 2020-04-25 DIAGNOSIS — Z01419 Encounter for gynecological examination (general) (routine) without abnormal findings: Secondary | ICD-10-CM | POA: Diagnosis not present

## 2020-04-25 DIAGNOSIS — Z0000 Encounter for general adult medical examination without abnormal findings: Secondary | ICD-10-CM

## 2020-04-25 DIAGNOSIS — R61 Generalized hyperhidrosis: Secondary | ICD-10-CM

## 2020-04-25 MED ORDER — TRANEXAMIC ACID 650 MG PO TABS: 1300.0000 mg | ORAL_TABLET | Freq: Three times a day (TID) | ORAL | 11 refills | Status: AC

## 2020-04-25 NOTE — Patient Instructions (Signed)
I value your feedback and entrusting us with your care. If you get a [facility] patient survey, I would appreciate you taking the time to let us know about your experience today. Thank you!  As of May 28, 2019, your lab results will be released to your MyChart immediately, before I even have a chance to see them. Please give me time to review them and contact you if there are any abnormalities. Thank you for your patience.  

## 2020-04-25 NOTE — Progress Notes (Signed)
PCP:  Donita Brooks, MD   Chief Complaint  Patient presents with  . Gynecologic Exam     HPI:      Ms. Melissa David is a 34 y.o. No obstetric history on file. who LMP was Patient's last menstrual period was 04/14/2020 (exact date)., presents today for her annual examination.  Her menses are Q4 wks, lasting 6-7 days, mod to heavy flow for ~3 days, changing super tampons Q 2 hrs now on lysteda (was hrly last yr before lysteda). Flow heavier since birth of last daughter.  Dysmenorrhea mild, improved with NSAIDs. She does not have intermenstrual bleeding. Hx of migraines with aura, no hx of HTN, DVTs. Started lysteda last yr with improvement. Pt wants to continue.   Has noticed significant night sweats regularly the past couple of months and is often sweating excessively during the day when not too hot. Normal thyroid 1/20. FH hypothyroidism.   Sex activity: single partner, contraception - vasectomy.  Last Pap: 04/20/19 Results: no abnormalities/neg HPV DNA. No hx of abn paps.   Last mammogram: 04/29/19 Results were normal, repeat age 80 There is no FH of breast cancer. There is no FH of ovarian cancer. The patient does do self-breast exams. She noticed a RT breast mass last yr with neg mammo. Area not as prominent now.  Tobacco use: The patient denies current or previous tobacco use. Alcohol use: none No drug use.  Exercise: mod active  She does get adequate calcium but not Vitamin D in her diet. Labs with PCP.   Past Medical History:  Diagnosis Date  . Family history of adverse reaction to anesthesia    malignant hyperthermia  . GERD (gastroesophageal reflux disease)   . Malignant hyperthermia    06/19/18: Patient's MOTHER has biopsy proven MH. (Patient to inquire about testing for herself and children).  . Ovarian cyst      Past Surgical History:  Procedure Laterality Date  . BUNIONECTOMY Left 06/20/2018   Procedure: Serafina Royals;  Surgeon: Park Liter, DPM;   Location: [facility] OR;  Service: Podiatry;  Laterality: Left;  . CESAREAN SECTION     2007 and 2010  . METATARSAL OSTEOTOMY Left 06/20/2018   Procedure: METATARSAL OSTEOTOMY 2ND LEFT;  Surgeon: Park Liter, DPM;  Location: [facility] OR;  Service: Podiatry;  Laterality: Left;  . WISDOM TOOTH EXTRACTION      Family History  Problem Relation Age of Onset  . Breast cancer Neg Hx   . Ovarian cancer Neg Hx     Social History   Socioeconomic History  . Marital status: Married    Spouse name: Not on file  . Number of children: Not on file  . Years of education: Not on file  . Highest education level: Not on file  Occupational History  . Not on file  Tobacco Use  . Smoking status: Never Smoker  . Smokeless tobacco: Never Used  Vaping Use  . Vaping Use: Never used  Substance and Sexual Activity  . Alcohol use: No  . Drug use: No  . Sexual activity: Yes    Birth control/protection: None    Comment: husband vasectomy   Other Topics Concern  . Not on file  Social History Narrative  . Not on file   Social Determinants of Health   Financial Resource Strain:   . Difficulty of Paying Living Expenses: Not on file  Food Insecurity:   . Worried About Programme researcher, broadcasting/film/video in the Last Year:  Not on file  . Ran Out of Food in the Last Year: Not on file  Transportation Needs:   . Lack of Transportation (Medical): Not on file  . Lack of Transportation (Non-Medical): Not on file  Physical Activity:   . Days of Exercise per Week: Not on file  . Minutes of Exercise per Session: Not on file  Stress:   . Feeling of Stress : Not on file  Social Connections:   . Frequency of Communication with Friends and Family: Not on file  . Frequency of Social Gatherings with Friends and Family: Not on file  . Attends Religious Services: Not on file  . Active Member of Clubs or Organizations: Not on file  . Attends Banker Meetings: Not on file  . Marital Status: Not on file  Intimate Partner  Violence:   . Fear of Current or Ex-Partner: Not on file  . Emotionally Abused: Not on file  . Physically Abused: Not on file  . Sexually Abused: Not on file   NO CURRENT MEDS  ROS:  Review of Systems  Constitutional: Negative for fatigue, fever and unexpected weight change.  Respiratory: Negative for cough, shortness of breath and wheezing.   Cardiovascular: Negative for chest pain, palpitations and leg swelling.  Gastrointestinal: Negative for blood in stool, constipation, diarrhea, nausea and vomiting.  Endocrine: Negative for cold intolerance, heat intolerance and polyuria.  Genitourinary: Negative for dyspareunia, dysuria, flank pain, frequency, genital sores, hematuria, menstrual problem, pelvic pain, urgency, vaginal bleeding, vaginal discharge and vaginal pain.  Musculoskeletal: Negative for back pain, joint swelling and myalgias.  Skin: Negative for rash.  Neurological: Negative for dizziness, syncope, light-headedness, numbness and headaches.  Hematological: Negative for adenopathy.  Psychiatric/Behavioral: Negative for agitation, confusion, sleep disturbance and suicidal ideas. The patient is not nervous/anxious.    BREAST: mass   Objective: BP 100/70   Ht 5\' 3"  (1.6 m)   Wt 138 lb (62.6 kg)   LMP 04/14/2020 (Exact Date)   BMI 24.45 kg/m    Physical Exam Constitutional:      Appearance: She is well-developed.  Genitourinary:     Vulva, vagina, cervix, uterus, right adnexa and left adnexa normal.     No vulval lesion or tenderness noted.     No vaginal discharge, erythema or tenderness.     No cervical polyp.     Uterus is not enlarged or tender.     No right or left adnexal mass present.     Right adnexa not tender.     Left adnexa not tender.  Neck:     Thyroid: Thyromegaly present.  Cardiovascular:     Rate and Rhythm: Normal rate and regular rhythm.     Heart sounds: Normal heart sounds. No murmur heard.   Pulmonary:     Effort: Pulmonary effort is  normal.     Breath sounds: Normal breath sounds.  Chest:     Breasts:        Right: No mass, nipple discharge, skin change or tenderness.        Left: No mass, nipple discharge, skin change or tenderness.  Abdominal:     Palpations: Abdomen is soft.     Tenderness: There is no abdominal tenderness. There is no guarding.  Musculoskeletal:        General: Normal range of motion.     Cervical back: Normal range of motion.  Neurological:     General: No focal deficit present.     Mental  Status: She is alert and oriented to person, place, and time.     Cranial Nerves: No cranial nerve deficit.  Skin:    General: Skin is warm and dry.  Psychiatric:        Mood and Affect: Mood normal.        Behavior: Behavior normal.        Thought Content: Thought content normal.        Judgment: Judgment normal.  Vitals reviewed.     Assessment/Plan: Encounter for annual routine gynecological examination  Menorrhagia with regular cycle - Plan: tranexamic acid (LYSTEDA) 650 MG TABS tablet; declines BC. Cont lysteda. Rx eRxd. F/u prn. Thyroid neg last yr but checking this yr anyway.  Blood tests for routine general physical examination - Plan: TSH + free T4, CBC w/Diff, Iron, TIBC and Ferritin Panel, Comprehensive metabolic panel  Thyroid disorder screening - Plan: TSH + free T4  Screening for deficiency anemia - Plan: CBC w/Diff, Iron, TIBC and Ferritin Panel  Night sweats - Plan: TSH + free T4, CBC w/Diff, Iron, TIBC and Ferritin Panel, Comprehensive metabolic panel  Enlarged thyroid--check labs. If neg, will check thyroid u/s.    Meds ordered this encounter  Medications  . tranexamic acid (LYSTEDA) 650 MG TABS tablet    Sig: Take 2 tablets (1,300 mg total) by mouth 3 (three) times daily. Take during menses for a maximum of five days    Dispense:  30 tablet    Refill:  11    Order Specific Question:   Supervising Provider    Answer:   Nadara Mustard [[id number]]             GYN counsel   adequate intake of calcium and vitamin D, diet and exercise     F/U  Return in about 1 year (around 04/25/2021).  Dreyson Mishkin B. Kalanie Fewell, PA-C 04/25/2020 2:45 PM

## 2020-04-26 ENCOUNTER — Encounter: Payer: Self-pay | Admitting: Obstetrics and Gynecology

## 2020-04-26 LAB — CBC WITH DIFFERENTIAL/PLATELET
Basophils Absolute: 0 10*3/uL (ref 0.0–0.2)
Basos: 0 %
EOS (ABSOLUTE): 0.2 10*3/uL (ref 0.0–0.4)
Eos: 3 %
Hematocrit: 41.3 % (ref 34.0–46.6)
Hemoglobin: 14.2 g/dL (ref 11.1–15.9)
Immature Grans (Abs): 0 10*3/uL (ref 0.0–0.1)
Immature Granulocytes: 0 %
Lymphocytes Absolute: 1.2 10*3/uL (ref 0.7–3.1)
Lymphs: 20 %
MCH: 31 pg (ref 26.6–33.0)
MCHC: 34.4 g/dL (ref 31.5–35.7)
MCV: 90 fL (ref 79–97)
Monocytes Absolute: 0.4 10*3/uL (ref 0.1–0.9)
Monocytes: 6 %
Neutrophils Absolute: 4.5 10*3/uL (ref 1.4–7.0)
Neutrophils: 71 %
Platelets: 255 10*3/uL (ref 150–450)
RBC: 4.58 x10E6/uL (ref 3.77–5.28)
RDW: 12 % (ref 11.7–15.4)
WBC: 6.3 10*3/uL (ref 3.4–10.8)

## 2020-04-26 LAB — COMPREHENSIVE METABOLIC PANEL
ALT: 15 IU/L (ref 0–32)
AST: 21 IU/L (ref 0–40)
Albumin/Globulin Ratio: 2.2 (ref 1.2–2.2)
Albumin: 4.9 g/dL — ABNORMAL HIGH (ref 3.8–4.8)
Alkaline Phosphatase: 102 IU/L (ref 44–121)
BUN/Creatinine Ratio: 14 (ref 9–23)
BUN: 13 mg/dL (ref 6–20)
Bilirubin Total: 0.3 mg/dL (ref 0.0–1.2)
CO2: 26 mmol/L (ref 20–29)
Calcium: 9.6 mg/dL (ref 8.7–10.2)
Chloride: 101 mmol/L (ref 96–106)
Creatinine, Ser: 0.95 mg/dL (ref 0.57–1.00)
GFR calc Af Amer: 89 mL/min/{1.73_m2} (ref >59)
GFR calc non Af Amer: 77 mL/min/{1.73_m2} (ref >59)
Globulin, Total: 2.2 g/dL (ref 1.5–4.5)
Glucose: 84 mg/dL (ref 65–99)
Potassium: 3.8 mmol/L (ref 3.5–5.2)
Sodium: 141 mmol/L (ref 134–144)
Total Protein: 7.1 g/dL (ref 6.0–8.5)

## 2020-04-26 LAB — TSH+FREE T4
Free T4: 1.12 ng/dL (ref 0.82–1.77)
TSH: 0.763 u[IU]/mL (ref 0.450–4.500)

## 2020-04-26 LAB — IRON,TIBC AND FERRITIN PANEL
Ferritin: 62 ng/mL (ref 15–150)
Iron Saturation: 22 % (ref 15–55)
Iron: 67 ug/dL (ref 27–159)
Total Iron Binding Capacity: 298 ug/dL (ref 250–450)
UIBC: 231 ug/dL (ref 131–425)

## 2020-04-26 NOTE — Addendum Note (Signed)
Addended by: Althea Grimmer B on: 04/26/2020 02:01 PM   Modules accepted: Orders

## 2020-05-04 ENCOUNTER — Ambulatory Visit (HOSPITAL_COMMUNITY): Payer: 59

## 2020-05-06 ENCOUNTER — Ambulatory Visit (INDEPENDENT_AMBULATORY_CARE_PROVIDER_SITE_OTHER): Payer: 59

## 2020-05-06 ENCOUNTER — Ambulatory Visit (INDEPENDENT_AMBULATORY_CARE_PROVIDER_SITE_OTHER): Payer: 59 | Admitting: Podiatry

## 2020-05-06 ENCOUNTER — Other Ambulatory Visit: Payer: Self-pay

## 2020-05-06 DIAGNOSIS — M2011 Hallux valgus (acquired), right foot: Secondary | ICD-10-CM

## 2020-05-06 DIAGNOSIS — M79671 Pain in right foot: Secondary | ICD-10-CM

## 2020-05-06 DIAGNOSIS — M2041 Other hammer toe(s) (acquired), right foot: Secondary | ICD-10-CM | POA: Diagnosis not present

## 2020-05-06 DIAGNOSIS — M7751 Other enthesopathy of right foot: Secondary | ICD-10-CM | POA: Diagnosis not present

## 2020-05-06 MED ORDER — MELOXICAM 15 MG PO TABS: 15.0000 mg | ORAL_TABLET | Freq: Every day | ORAL | 1 refills | Status: AC

## 2020-05-10 ENCOUNTER — Other Ambulatory Visit: Payer: Self-pay | Admitting: Obstetrics and Gynecology

## 2020-05-10 DIAGNOSIS — N920 Excessive and frequent menstruation with regular cycle: Secondary | ICD-10-CM

## 2020-05-11 ENCOUNTER — Other Ambulatory Visit: Payer: Self-pay | Admitting: Podiatry

## 2020-05-11 DIAGNOSIS — M2011 Hallux valgus (acquired), right foot: Secondary | ICD-10-CM

## 2020-05-17 NOTE — Progress Notes (Signed)
  Subjective:  Patient ID: Melissa David, female    DOB: [birth year],  MRN: [id number]  Chief Complaint  Patient presents with  . Foot Pain    Right dorsal forefoot pain 3 week duration acute onset no known injuries.    34 y.o. female presents with the above complaint. History confirmed with patient.   Objective:  Physical Exam: warm, good capillary refill, no trophic changes or ulcerative lesions, normal DP and PT pulses and normal sensory exam.  Right Foot: Pain palpation at the second MPJ without warmth erythema.  No pedal patient about the first metatarsophalangeal joint  No images are attached to the encounter.  Radiographs: X-ray of the right foot: no fracture, dislocation, swelling or degenerative changes noted and elongated second metatarsal Assessment:   1. Capsulitis of metatarsophalangeal (MTP) joint of right foot   2. Hammer toe of right foot    Plan:  Patient was evaluated and treated and all questions answered.  Capsulitis -Educated on etiology -XR reviewed with patient -Discussed padding and proper shoegear -Offloading pad applied -Rx meloxicam  No follow-ups on file.

## 2020-05-23 ENCOUNTER — Ambulatory Visit: Admission: RE | Admit: 2020-05-23 | Payer: 59 | Source: Ambulatory Visit

## 2021-05-02 ENCOUNTER — Other Ambulatory Visit: Payer: Self-pay | Admitting: Obstetrics and Gynecology

## 2021-05-02 DIAGNOSIS — N920 Excessive and frequent menstruation with regular cycle: Secondary | ICD-10-CM

## 2021-05-29 DIAGNOSIS — M222X2 Patellofemoral disorders, left knee: Secondary | ICD-10-CM | POA: Diagnosis not present

## 2022-06-12 ENCOUNTER — Ambulatory Visit: Admit: 2022-06-12 | Discharge status: Absent without leave | Payer: 59

## 2022-06-12 DIAGNOSIS — J0100 Acute maxillary sinusitis, unspecified: Secondary | ICD-10-CM | POA: Diagnosis not present

## 2022-11-19 DIAGNOSIS — M62830 Muscle spasm of back: Secondary | ICD-10-CM | POA: Diagnosis not present

## 2022-11-19 DIAGNOSIS — M9903 Segmental and somatic dysfunction of lumbar region: Secondary | ICD-10-CM | POA: Diagnosis not present

## 2022-11-19 DIAGNOSIS — R293 Abnormal posture: Secondary | ICD-10-CM | POA: Diagnosis not present

## 2022-11-19 DIAGNOSIS — M62452 Contracture of muscle, left thigh: Secondary | ICD-10-CM | POA: Diagnosis not present

## 2022-11-19 DIAGNOSIS — M9901 Segmental and somatic dysfunction of cervical region: Secondary | ICD-10-CM | POA: Diagnosis not present

## 2022-11-19 DIAGNOSIS — M9906 Segmental and somatic dysfunction of lower extremity: Secondary | ICD-10-CM | POA: Diagnosis not present

## 2023-02-19 DIAGNOSIS — K219 Gastro-esophageal reflux disease without esophagitis: Secondary | ICD-10-CM | POA: Diagnosis not present

## 2023-02-19 DIAGNOSIS — E538 Deficiency of other specified B group vitamins: Secondary | ICD-10-CM | POA: Diagnosis not present

## 2023-02-19 DIAGNOSIS — R6882 Decreased libido: Secondary | ICD-10-CM | POA: Diagnosis not present

## 2023-02-19 DIAGNOSIS — E612 Magnesium deficiency: Secondary | ICD-10-CM | POA: Diagnosis not present

## 2023-02-19 DIAGNOSIS — E559 Vitamin D deficiency, unspecified: Secondary | ICD-10-CM | POA: Diagnosis not present

## 2023-02-19 DIAGNOSIS — N926 Irregular menstruation, unspecified: Secondary | ICD-10-CM | POA: Diagnosis not present

## 2023-02-19 DIAGNOSIS — R5382 Chronic fatigue, unspecified: Secondary | ICD-10-CM | POA: Diagnosis not present

## 2023-02-19 DIAGNOSIS — Z131 Encounter for screening for diabetes mellitus: Secondary | ICD-10-CM | POA: Diagnosis not present

## 2023-02-19 DIAGNOSIS — Z1329 Encounter for screening for other suspected endocrine disorder: Secondary | ICD-10-CM | POA: Diagnosis not present

## 2023-03-08 DIAGNOSIS — N926 Irregular menstruation, unspecified: Secondary | ICD-10-CM | POA: Diagnosis not present

## 2023-03-13 DIAGNOSIS — E611 Iron deficiency: Secondary | ICD-10-CM | POA: Diagnosis not present

## 2023-03-18 DIAGNOSIS — E611 Iron deficiency: Secondary | ICD-10-CM | POA: Diagnosis not present

## 2023-03-18 DIAGNOSIS — E612 Magnesium deficiency: Secondary | ICD-10-CM | POA: Diagnosis not present

## 2023-03-18 DIAGNOSIS — E538 Deficiency of other specified B group vitamins: Secondary | ICD-10-CM | POA: Diagnosis not present

## 2023-03-18 DIAGNOSIS — R6882 Decreased libido: Secondary | ICD-10-CM | POA: Diagnosis not present

## 2023-03-18 DIAGNOSIS — R5382 Chronic fatigue, unspecified: Secondary | ICD-10-CM | POA: Diagnosis not present

## 2023-03-18 DIAGNOSIS — N926 Irregular menstruation, unspecified: Secondary | ICD-10-CM | POA: Diagnosis not present

## 2023-03-18 DIAGNOSIS — E039 Hypothyroidism, unspecified: Secondary | ICD-10-CM | POA: Diagnosis not present

## 2023-03-18 DIAGNOSIS — E559 Vitamin D deficiency, unspecified: Secondary | ICD-10-CM | POA: Diagnosis not present

## 2023-03-20 DIAGNOSIS — E611 Iron deficiency: Secondary | ICD-10-CM | POA: Diagnosis not present

## 2023-03-27 DIAGNOSIS — E611 Iron deficiency: Secondary | ICD-10-CM | POA: Diagnosis not present

## 2023-04-03 DIAGNOSIS — E611 Iron deficiency: Secondary | ICD-10-CM | POA: Diagnosis not present

## 2023-05-20 DIAGNOSIS — E538 Deficiency of other specified B group vitamins: Secondary | ICD-10-CM | POA: Diagnosis not present

## 2023-05-20 DIAGNOSIS — N926 Irregular menstruation, unspecified: Secondary | ICD-10-CM | POA: Diagnosis not present

## 2023-05-20 DIAGNOSIS — E559 Vitamin D deficiency, unspecified: Secondary | ICD-10-CM | POA: Diagnosis not present

## 2023-05-20 DIAGNOSIS — E612 Magnesium deficiency: Secondary | ICD-10-CM | POA: Diagnosis not present

## 2023-05-20 DIAGNOSIS — R6882 Decreased libido: Secondary | ICD-10-CM | POA: Diagnosis not present

## 2023-05-20 DIAGNOSIS — R5382 Chronic fatigue, unspecified: Secondary | ICD-10-CM | POA: Diagnosis not present

## 2023-05-20 DIAGNOSIS — E039 Hypothyroidism, unspecified: Secondary | ICD-10-CM | POA: Diagnosis not present

## 2023-05-20 DIAGNOSIS — E611 Iron deficiency: Secondary | ICD-10-CM | POA: Diagnosis not present

## 2023-06-17 DIAGNOSIS — E538 Deficiency of other specified B group vitamins: Secondary | ICD-10-CM | POA: Diagnosis not present

## 2023-06-17 DIAGNOSIS — E039 Hypothyroidism, unspecified: Secondary | ICD-10-CM | POA: Diagnosis not present

## 2023-06-17 DIAGNOSIS — E611 Iron deficiency: Secondary | ICD-10-CM | POA: Diagnosis not present

## 2023-06-17 DIAGNOSIS — R6882 Decreased libido: Secondary | ICD-10-CM | POA: Diagnosis not present

## 2023-06-17 DIAGNOSIS — N926 Irregular menstruation, unspecified: Secondary | ICD-10-CM | POA: Diagnosis not present

## 2023-06-17 DIAGNOSIS — E559 Vitamin D deficiency, unspecified: Secondary | ICD-10-CM | POA: Diagnosis not present

## 2023-06-17 DIAGNOSIS — E612 Magnesium deficiency: Secondary | ICD-10-CM | POA: Diagnosis not present

## 2023-06-17 DIAGNOSIS — R5382 Chronic fatigue, unspecified: Secondary | ICD-10-CM | POA: Diagnosis not present

## 2023-07-31 DIAGNOSIS — E039 Hypothyroidism, unspecified: Secondary | ICD-10-CM | POA: Diagnosis not present

## 2023-09-05 DIAGNOSIS — R6882 Decreased libido: Secondary | ICD-10-CM | POA: Diagnosis not present

## 2023-09-05 DIAGNOSIS — E612 Magnesium deficiency: Secondary | ICD-10-CM | POA: Diagnosis not present

## 2023-09-05 DIAGNOSIS — R5382 Chronic fatigue, unspecified: Secondary | ICD-10-CM | POA: Diagnosis not present

## 2023-09-05 DIAGNOSIS — E559 Vitamin D deficiency, unspecified: Secondary | ICD-10-CM | POA: Diagnosis not present

## 2023-09-05 DIAGNOSIS — Z1329 Encounter for screening for other suspected endocrine disorder: Secondary | ICD-10-CM | POA: Diagnosis not present

## 2023-09-05 DIAGNOSIS — N926 Irregular menstruation, unspecified: Secondary | ICD-10-CM | POA: Diagnosis not present

## 2023-09-05 DIAGNOSIS — K219 Gastro-esophageal reflux disease without esophagitis: Secondary | ICD-10-CM | POA: Diagnosis not present

## 2023-09-05 DIAGNOSIS — E538 Deficiency of other specified B group vitamins: Secondary | ICD-10-CM | POA: Diagnosis not present

## 2023-09-05 DIAGNOSIS — Z131 Encounter for screening for diabetes mellitus: Secondary | ICD-10-CM | POA: Diagnosis not present

## 2023-09-05 DIAGNOSIS — E039 Hypothyroidism, unspecified: Secondary | ICD-10-CM | POA: Diagnosis not present

## 2023-09-05 DIAGNOSIS — E611 Iron deficiency: Secondary | ICD-10-CM | POA: Diagnosis not present

## 2023-09-09 DIAGNOSIS — E538 Deficiency of other specified B group vitamins: Secondary | ICD-10-CM | POA: Diagnosis not present

## 2023-09-09 DIAGNOSIS — E559 Vitamin D deficiency, unspecified: Secondary | ICD-10-CM | POA: Diagnosis not present

## 2023-09-09 DIAGNOSIS — E612 Magnesium deficiency: Secondary | ICD-10-CM | POA: Diagnosis not present

## 2023-09-09 DIAGNOSIS — E611 Iron deficiency: Secondary | ICD-10-CM | POA: Diagnosis not present

## 2023-09-09 DIAGNOSIS — N926 Irregular menstruation, unspecified: Secondary | ICD-10-CM | POA: Diagnosis not present

## 2023-09-09 DIAGNOSIS — R5382 Chronic fatigue, unspecified: Secondary | ICD-10-CM | POA: Diagnosis not present

## 2023-09-09 DIAGNOSIS — R6882 Decreased libido: Secondary | ICD-10-CM | POA: Diagnosis not present

## 2023-09-09 DIAGNOSIS — E039 Hypothyroidism, unspecified: Secondary | ICD-10-CM | POA: Diagnosis not present

## 2023-09-15 DIAGNOSIS — R059 Cough, unspecified: Secondary | ICD-10-CM | POA: Diagnosis not present

## 2023-09-15 DIAGNOSIS — J019 Acute sinusitis, unspecified: Secondary | ICD-10-CM | POA: Diagnosis not present

## 2023-11-22 ENCOUNTER — Ambulatory Visit (INDEPENDENT_AMBULATORY_CARE_PROVIDER_SITE_OTHER): Admitting: Podiatry

## 2023-11-22 ENCOUNTER — Encounter: Payer: Self-pay | Admitting: Podiatry

## 2023-11-22 DIAGNOSIS — L600 Ingrowing nail: Secondary | ICD-10-CM | POA: Diagnosis not present

## 2023-11-22 MED ORDER — AMOXICILLIN-POT CLAVULANATE 875-125 MG PO TABS: 1.0000 | ORAL_TABLET | Freq: Two times a day (BID) | ORAL | 0 refills | Status: AC

## 2023-11-22 NOTE — Progress Notes (Signed)
 Patient complains of painful ingrown proximal nail fold toe hallux left.  Has had redness and swelling and some drainage for over a week now.  No history of any trauma patient denies fevers, chills, nausea, vomiting.  Objective:  Vitals: Reviewed  General: Well developed, nourished, in no acute distress, alert and oriented x3   Vascular: DP pulse 2/4 bilateral. PT pulse 2/4 bilateral.  Localized edema hallux left  Dermatology: Erythema, edema, incurvated nail border hallux left with purulent drainage along proximal nail fold. Tenderness present with palpation. Normal skin tone and texture feet with normal hair growth.  Neurological: Grossly intact. Normal reflexes.   Musculoskeletal: Tenderness with palpation of the distal hallux left. No tenderness or painful ROM at IPJ.  Diagnosis: Ingrown nail hallux left  Plan: -discussed etiology and treatment of ingrown nails. Discussed surgical vs conservative treatment. -Consent signed for avulsion hallux nail left -Rx: Augmentin 875 mg 1 p.o. twice daily 7 days - Cultures taken of drainage for culture and sensitivity  Procedure(s):   - Avulsion hallux nail left.: Toe anesthetized with 3cc 2:1 mixture 2% Lidocaine  with epinephrine: Sodium Bicarbonate. Surgical site prepped.  Avulsion of nail left. performed.  Applied triple antibiotic to nailbed and applied gauze and Coban dressing. - Written and oral postoperative instructions given.  -Return for post-op 2 weeks.  Baker Bon, DPM

## 2023-11-22 NOTE — Patient Instructions (Signed)

## 2023-11-22 NOTE — Addendum Note (Signed)
 Addended by: Reche Canales on: 11/22/2023 07:55 AM   Modules accepted: Orders

## 2023-11-25 ENCOUNTER — Ambulatory Visit: Admitting: Podiatry

## 2023-12-09 ENCOUNTER — Ambulatory Visit (INDEPENDENT_AMBULATORY_CARE_PROVIDER_SITE_OTHER): Admitting: Podiatry

## 2023-12-09 ENCOUNTER — Encounter: Payer: Self-pay | Admitting: Podiatry

## 2023-12-09 DIAGNOSIS — L600 Ingrowing nail: Secondary | ICD-10-CM

## 2023-12-09 NOTE — Progress Notes (Signed)
 Patient presents follow-up nail surgery left great toe.  No complaints.  Physical exam:  Dermatologic: Nail surgery site for matrixectomy healing well with no signs of infection.  Diagnosis: 1.  Status post matrixectomy toe.  Healing well  Plan: - POV status post nail surgery hallux right if patient has any problems she can call for appointment otherwise we can see her as needed - Return as needed

## 2024-01-21 DIAGNOSIS — F4323 Adjustment disorder with mixed anxiety and depressed mood: Secondary | ICD-10-CM | POA: Diagnosis not present

## 2024-01-27 DIAGNOSIS — B379 Candidiasis, unspecified: Secondary | ICD-10-CM | POA: Diagnosis not present

## 2024-02-03 DIAGNOSIS — F4323 Adjustment disorder with mixed anxiety and depressed mood: Secondary | ICD-10-CM | POA: Diagnosis not present

## 2024-02-05 DIAGNOSIS — F4323 Adjustment disorder with mixed anxiety and depressed mood: Secondary | ICD-10-CM | POA: Diagnosis not present

## 2024-02-05 DIAGNOSIS — J018 Other acute sinusitis: Secondary | ICD-10-CM | POA: Diagnosis not present

## 2024-02-11 DIAGNOSIS — R058 Other specified cough: Secondary | ICD-10-CM | POA: Diagnosis not present

## 2024-02-11 DIAGNOSIS — R051 Acute cough: Secondary | ICD-10-CM | POA: Diagnosis not present

## 2024-02-11 DIAGNOSIS — J029 Acute pharyngitis, unspecified: Secondary | ICD-10-CM | POA: Diagnosis not present

## 2024-02-11 DIAGNOSIS — R0602 Shortness of breath: Secondary | ICD-10-CM | POA: Diagnosis not present

## 2024-02-13 DIAGNOSIS — R5382 Chronic fatigue, unspecified: Secondary | ICD-10-CM | POA: Diagnosis not present

## 2024-02-13 DIAGNOSIS — E611 Iron deficiency: Secondary | ICD-10-CM | POA: Diagnosis not present

## 2024-02-14 DIAGNOSIS — F4323 Adjustment disorder with mixed anxiety and depressed mood: Secondary | ICD-10-CM | POA: Diagnosis not present

## 2024-02-20 DIAGNOSIS — F4323 Adjustment disorder with mixed anxiety and depressed mood: Secondary | ICD-10-CM | POA: Diagnosis not present

## 2024-02-24 DIAGNOSIS — F4323 Adjustment disorder with mixed anxiety and depressed mood: Secondary | ICD-10-CM | POA: Diagnosis not present

## 2024-03-03 DIAGNOSIS — F4323 Adjustment disorder with mixed anxiety and depressed mood: Secondary | ICD-10-CM | POA: Diagnosis not present

## 2024-03-10 DIAGNOSIS — F4323 Adjustment disorder with mixed anxiety and depressed mood: Secondary | ICD-10-CM | POA: Diagnosis not present

## 2024-03-18 DIAGNOSIS — F4323 Adjustment disorder with mixed anxiety and depressed mood: Secondary | ICD-10-CM | POA: Diagnosis not present

## 2024-03-23 DIAGNOSIS — F4323 Adjustment disorder with mixed anxiety and depressed mood: Secondary | ICD-10-CM | POA: Diagnosis not present

## 2024-03-30 DIAGNOSIS — F4323 Adjustment disorder with mixed anxiety and depressed mood: Secondary | ICD-10-CM | POA: Diagnosis not present

## 2024-04-01 DIAGNOSIS — F4323 Adjustment disorder with mixed anxiety and depressed mood: Secondary | ICD-10-CM | POA: Diagnosis not present

## 2024-04-06 DIAGNOSIS — F4323 Adjustment disorder with mixed anxiety and depressed mood: Secondary | ICD-10-CM | POA: Diagnosis not present

## 2024-04-13 DIAGNOSIS — F4323 Adjustment disorder with mixed anxiety and depressed mood: Secondary | ICD-10-CM | POA: Diagnosis not present

## 2024-04-20 DIAGNOSIS — F4323 Adjustment disorder with mixed anxiety and depressed mood: Secondary | ICD-10-CM | POA: Diagnosis not present

## 2024-04-22 DIAGNOSIS — Z113 Encounter for screening for infections with a predominantly sexual mode of transmission: Secondary | ICD-10-CM | POA: Diagnosis not present

## 2024-04-28 DIAGNOSIS — F4323 Adjustment disorder with mixed anxiety and depressed mood: Secondary | ICD-10-CM | POA: Diagnosis not present

## 2024-05-05 DIAGNOSIS — F4323 Adjustment disorder with mixed anxiety and depressed mood: Secondary | ICD-10-CM | POA: Diagnosis not present

## 2024-05-12 DIAGNOSIS — F4323 Adjustment disorder with mixed anxiety and depressed mood: Secondary | ICD-10-CM | POA: Diagnosis not present

## 2024-05-20 DIAGNOSIS — F4323 Adjustment disorder with mixed anxiety and depressed mood: Secondary | ICD-10-CM | POA: Diagnosis not present

## 2024-05-22 DIAGNOSIS — E039 Hypothyroidism, unspecified: Secondary | ICD-10-CM | POA: Diagnosis not present

## 2024-05-22 DIAGNOSIS — E538 Deficiency of other specified B group vitamins: Secondary | ICD-10-CM | POA: Diagnosis not present

## 2024-05-22 DIAGNOSIS — E612 Magnesium deficiency: Secondary | ICD-10-CM | POA: Diagnosis not present

## 2024-05-22 DIAGNOSIS — E611 Iron deficiency: Secondary | ICD-10-CM | POA: Diagnosis not present

## 2024-05-22 DIAGNOSIS — N926 Irregular menstruation, unspecified: Secondary | ICD-10-CM | POA: Diagnosis not present

## 2024-05-22 DIAGNOSIS — R5382 Chronic fatigue, unspecified: Secondary | ICD-10-CM | POA: Diagnosis not present

## 2024-05-22 DIAGNOSIS — E559 Vitamin D deficiency, unspecified: Secondary | ICD-10-CM | POA: Diagnosis not present

## 2024-05-25 DIAGNOSIS — F4323 Adjustment disorder with mixed anxiety and depressed mood: Secondary | ICD-10-CM | POA: Diagnosis not present

## 2024-06-01 DIAGNOSIS — F4323 Adjustment disorder with mixed anxiety and depressed mood: Secondary | ICD-10-CM | POA: Diagnosis not present

## 2024-06-08 DIAGNOSIS — F4323 Adjustment disorder with mixed anxiety and depressed mood: Secondary | ICD-10-CM | POA: Diagnosis not present

## 2024-06-15 ENCOUNTER — Ambulatory Visit: Admitting: Obstetrics

## 2024-06-15 ENCOUNTER — Encounter: Payer: Self-pay | Admitting: Obstetrics

## 2024-06-15 ENCOUNTER — Other Ambulatory Visit (HOSPITAL_COMMUNITY)
Admission: RE | Admit: 2024-06-15 | Discharge: 2024-06-15 | Disposition: A | Discharge status: Home / Self Care | Source: Ambulatory Visit | Attending: Obstetrics | Admitting: Obstetrics

## 2024-06-15 VITALS — BP 106/70 | HR 60 | Ht 63.0 in | Wt 133.0 lb

## 2024-06-15 DIAGNOSIS — F4323 Adjustment disorder with mixed anxiety and depressed mood: Secondary | ICD-10-CM | POA: Diagnosis not present

## 2024-06-15 DIAGNOSIS — Z01419 Encounter for gynecological examination (general) (routine) without abnormal findings: Secondary | ICD-10-CM

## 2024-06-15 DIAGNOSIS — Z124 Encounter for screening for malignant neoplasm of cervix: Secondary | ICD-10-CM

## 2024-06-15 DIAGNOSIS — Z01411 Encounter for gynecological examination (general) (routine) with abnormal findings: Secondary | ICD-10-CM

## 2024-06-15 DIAGNOSIS — Z1231 Encounter for screening mammogram for malignant neoplasm of breast: Secondary | ICD-10-CM

## 2024-06-15 DIAGNOSIS — N920 Excessive and frequent menstruation with regular cycle: Secondary | ICD-10-CM | POA: Insufficient documentation

## 2024-06-15 NOTE — Progress Notes (Signed)
 "  ANNUAL GYNECOLOGICAL EXAM  SUBJECTIVE  HPI  Melissa David is a 38 y.o.-year-old G2P2002 who presents for an annual gynecological exam today.  She denies pelvic pain, dyspareunia, abnormal vaginal discharge, and UTI symptoms. She has had heavy cycles since menarche, but her cycles have gotten even heavier since the birth of her children. She bleeds through a pad about q 1 hour for 4-6 days of her period and passes clots up to the size of a golf ball. She has received iron infusions for blood loss anemia. She tried TXA but felt it made her periods longer. She is sexually active with one female partner who has had a vasectomy.  Medical/Surgical History Past Medical History:  Diagnosis Date   Family history of adverse reaction to anesthesia    malignant hyperthermia   GERD (gastroesophageal reflux disease)    Malignant hyperthermia    06/19/18: Patient's MOTHER has biopsy proven MH. (Patient to inquire about testing for herself and children).   Ovarian cyst    Past Surgical History:  Procedure Laterality Date   BUNIONECTOMY Left 06/20/2018   Procedure: MASSIE EDWARDS;  Surgeon: Gretel Ozell PARAS, DPM;  Location: [facility] OR;  Service: Podiatry;  Laterality: Left;   CESAREAN SECTION     2007 and 2010   METATARSAL OSTEOTOMY Left 06/20/2018   Procedure: METATARSAL OSTEOTOMY 2ND LEFT;  Surgeon: Gretel Ozell PARAS, DPM;  Location: [facility] OR;  Service: Podiatry;  Laterality: Left;   WISDOM TOOTH EXTRACTION      Social History Lives with husband and kids . 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 regular periods every 307 days Last Pap:04/20/2019 Contraception:  vasectomy  Prevention Dentist utd  Eye exam utd  Mammogram ordered today  Colonoscopy: at 43 Flu shot/vaccines no   Current Medications Show/hide medication list[1]      ROS Constitutional: Denied constitutional symptoms, night sweats, recent illness, fatigue, fever, insomnia and weight loss.  Eyes: Denied eye symptoms, eye pain, photophobia, vision change and visual disturbance.  Ears/Nose/Throat/Neck: Denied ear, nose, throat or neck symptoms, hearing loss, nasal discharge, sinus congestion and sore throat.  Cardiovascular: Denied cardiovascular symptoms, arrhythmia, chest pain/pressure, edema, exercise intolerance, orthopnea and palpitations.  Respiratory: Denied pulmonary symptoms, asthma, pleuritic pain, productive sputum, cough, dyspnea and wheezing.  Gastrointestinal: Denied gastro-esophageal reflux, melena, nausea and vomiting.  Genitourinary: See HPI  Musculoskeletal: Denied musculoskeletal symptoms, stiffness, swelling, muscle weakness and myalgia.  Dermatologic: Denied dermatology symptoms, rash and scar.  Neurologic: Denied neurology symptoms, dizziness, headache, neck pain and syncope.  Psychiatric: Denied psychiatric symptoms, anxiety and depression.  Endocrine: Denied endocrine symptoms including hot flashes and night sweats.    OBJECTIVE  BP 106/70   Pulse 60   Ht 5' 3 (1.6 m)   Wt 133 lb (60.3 kg)   LMP 05/22/2024   BMI 23.56 kg/m    Physical examination General NAD, Conversant  HEENT Atraumatic; Op clear with  mmm.  Normo-cephalic. Pupils reactive. Anicteric sclerae  Thyroid /Neck Smooth without nodularity or enlargement. Normal ROM.  Neck Supple.  Skin No rashes, lesions or ulceration. Normal palpated skin turgor. No nodularity.  Breasts: No masses or discharge.  Symmetric.  No axillary adenopathy.  Lungs: Clear to auscultation.No rales or wheezes. Normal Respiratory effort, no retractions.  Heart: NSR.  No murmurs or rubs appreciated. No peripheral  edema  Abdomen: Soft.  Non-tender.  No masses.  No HSM. No hernia  Extremities: Moves all appropriately.  Normal ROM for age. No lymphadenopathy.  Neuro: Oriented to PPT.  Normal mood. Normal affect.     Pelvic:   Vulva: Normal appearance.  No lesions.  Vagina: No lesions or abnormalities noted.  Support: Normal pelvic support.  Urethra No masses tenderness or scarring.  Meatus Normal size without lesions or prolapse.  Cervix: Normal appearance.  No lesions. Pap collected.  Perineum: Normal exam.  No lesions.        Bimanual   Uterus: Normal size.  Non-tender.  Mobile.  AV.  Adnexae: No masses.  Non-tender to palpation.  Cul-de-sac: Negative for abnormality.    ASSESSMENT  1) Annual exam 2) Due for Pap 3) Heavy menstrual bleeding  PLAN 1) Physical exam as noted. Discussed healthy lifestyle choices and preventive care. Declines STI testing. Routine labs done with PCP. 2) Pap collected. F/u based on results. 3) Discussed options including progesterone -only contraception (pills, IUD, Depo, Nexplanon) and surgical options such as ablation. Pelvic US  ordered. Helia would like to consider her options. She will schedule a f/u visit for contraception or a visit with MD if she decides on ablation.  Return in one year for annual exam or as needed for concerns.   Eleanor Canny, CNM      [1]  Outpatient Medications Prior to Visit  Medication Sig   thyroid  (ARMOUR) 60 MG tablet Take 60 mg by mouth daily before breakfast.   meloxicam  (MOBIC ) 15 MG tablet Take 1 tablet (15 mg total) by mouth daily. (Patient not taking: Reported on 06/15/2024)   tranexamic acid  (LYSTEDA ) 650 MG TABS tablet Take 2 tablets (1,300 mg total) by mouth 3 (three) times daily. Take during menses for a maximum of five days (Patient not taking: Reported on 06/15/2024)   No facility-administered medications prior to visit.   "

## 2024-06-15 NOTE — Patient Instructions (Addendum)
 SABRA

## 2024-06-17 ENCOUNTER — Encounter: Payer: Self-pay | Admitting: Obstetrics

## 2024-06-17 ENCOUNTER — Other Ambulatory Visit: Payer: Self-pay | Admitting: Obstetrics

## 2024-06-17 LAB — CYTOLOGY - PAP
Comment: NEGATIVE
Diagnosis: NEGATIVE
High risk HPV: NEGATIVE

## 2024-06-17 MED ORDER — METRONIDAZOLE 500 MG PO TABS: 500.0000 mg | ORAL_TABLET | Freq: Two times a day (BID) | ORAL | 0 refills | Status: AC

## 2024-06-17 NOTE — Progress Notes (Signed)
+  BV. Rx for metronidazole 500 mg PO BID x 7 days sent to pharmacy. David notified via MyChart.  M. Justino, CNM

## 2024-06-30 ENCOUNTER — Ambulatory Visit

## 2024-06-30 ENCOUNTER — Other Ambulatory Visit: Payer: Self-pay | Admitting: Obstetrics

## 2024-06-30 DIAGNOSIS — Z01419 Encounter for gynecological examination (general) (routine) without abnormal findings: Secondary | ICD-10-CM

## 2024-06-30 DIAGNOSIS — N920 Excessive and frequent menstruation with regular cycle: Secondary | ICD-10-CM

## 2024-06-30 DIAGNOSIS — Z1231 Encounter for screening mammogram for malignant neoplasm of breast: Secondary | ICD-10-CM

## 2024-06-30 DIAGNOSIS — Z124 Encounter for screening for malignant neoplasm of cervix: Secondary | ICD-10-CM

## 2024-07-06 ENCOUNTER — Ambulatory Visit: Payer: Self-pay | Admitting: Obstetrics
